# Patient Record
Sex: Female | Born: 1960 | Race: White | Hispanic: No | Marital: Married | Smoking: Former smoker
Health system: Southern US, Community
[De-identification: ages and names within clinical notes are randomized; demographics above are authoritative.]

## PROBLEM LIST (undated history)

## (undated) DIAGNOSIS — E039 Hypothyroidism, unspecified: Secondary | ICD-10-CM

## (undated) DIAGNOSIS — K219 Gastro-esophageal reflux disease without esophagitis: Secondary | ICD-10-CM

## (undated) HISTORY — PX: OTHER SURGICAL HISTORY: SHX169

## (undated) HISTORY — PX: NOVASURE ABLATION: SHX5394

## (undated) HISTORY — PX: CARDIAC CATHETERIZATION: SHX172

---

## 2006-09-11 ENCOUNTER — Ambulatory Visit: Payer: Self-pay

## 2007-05-20 ENCOUNTER — Encounter: Payer: Self-pay | Admitting: Urology

## 2007-05-25 ENCOUNTER — Encounter: Payer: Self-pay | Admitting: Urology

## 2008-11-11 ENCOUNTER — Ambulatory Visit: Payer: Self-pay | Admitting: Obstetrics and Gynecology

## 2008-11-14 ENCOUNTER — Ambulatory Visit: Payer: Self-pay | Admitting: Gastroenterology

## 2008-11-18 ENCOUNTER — Ambulatory Visit: Payer: Self-pay | Admitting: Obstetrics and Gynecology

## 2012-05-14 DIAGNOSIS — Z79899 Other long term (current) drug therapy: Secondary | ICD-10-CM | POA: Insufficient documentation

## 2013-05-17 DIAGNOSIS — N3946 Mixed incontinence: Secondary | ICD-10-CM | POA: Insufficient documentation

## 2013-05-17 DIAGNOSIS — R350 Frequency of micturition: Secondary | ICD-10-CM | POA: Insufficient documentation

## 2013-05-17 DIAGNOSIS — R3915 Urgency of urination: Secondary | ICD-10-CM | POA: Insufficient documentation

## 2014-03-23 ENCOUNTER — Ambulatory Visit: Payer: Self-pay | Admitting: Family Medicine

## 2014-03-23 DIAGNOSIS — I369 Nonrheumatic tricuspid valve disorder, unspecified: Secondary | ICD-10-CM

## 2014-06-06 ENCOUNTER — Ambulatory Visit: Payer: Self-pay | Admitting: Gastroenterology

## 2015-04-27 DIAGNOSIS — M722 Plantar fascial fibromatosis: Secondary | ICD-10-CM | POA: Insufficient documentation

## 2015-06-15 DIAGNOSIS — M19049 Primary osteoarthritis, unspecified hand: Secondary | ICD-10-CM | POA: Insufficient documentation

## 2015-09-11 DIAGNOSIS — G43019 Migraine without aura, intractable, without status migrainosus: Secondary | ICD-10-CM | POA: Insufficient documentation

## 2015-09-11 DIAGNOSIS — Z8619 Personal history of other infectious and parasitic diseases: Secondary | ICD-10-CM | POA: Insufficient documentation

## 2015-11-03 DIAGNOSIS — M542 Cervicalgia: Secondary | ICD-10-CM | POA: Insufficient documentation

## 2016-06-02 DIAGNOSIS — E039 Hypothyroidism, unspecified: Secondary | ICD-10-CM | POA: Insufficient documentation

## 2016-06-02 DIAGNOSIS — F411 Generalized anxiety disorder: Secondary | ICD-10-CM | POA: Insufficient documentation

## 2016-06-02 DIAGNOSIS — E782 Mixed hyperlipidemia: Secondary | ICD-10-CM | POA: Insufficient documentation

## 2016-08-26 DIAGNOSIS — M79673 Pain in unspecified foot: Secondary | ICD-10-CM | POA: Insufficient documentation

## 2016-08-26 DIAGNOSIS — S93409A Sprain of unspecified ligament of unspecified ankle, initial encounter: Secondary | ICD-10-CM | POA: Insufficient documentation

## 2019-09-10 DIAGNOSIS — R202 Paresthesia of skin: Secondary | ICD-10-CM | POA: Insufficient documentation

## 2019-09-10 DIAGNOSIS — R200 Anesthesia of skin: Secondary | ICD-10-CM | POA: Insufficient documentation

## 2020-03-01 ENCOUNTER — Ambulatory Visit (INDEPENDENT_AMBULATORY_CARE_PROVIDER_SITE_OTHER): Payer: 59 | Admitting: Dermatology

## 2020-03-01 ENCOUNTER — Other Ambulatory Visit: Payer: Self-pay

## 2020-03-01 DIAGNOSIS — L821 Other seborrheic keratosis: Secondary | ICD-10-CM

## 2020-03-01 DIAGNOSIS — L578 Other skin changes due to chronic exposure to nonionizing radiation: Secondary | ICD-10-CM | POA: Diagnosis not present

## 2020-03-01 DIAGNOSIS — D2372 Other benign neoplasm of skin of left lower limb, including hip: Secondary | ICD-10-CM

## 2020-03-01 DIAGNOSIS — D239 Other benign neoplasm of skin, unspecified: Secondary | ICD-10-CM

## 2020-03-01 DIAGNOSIS — L820 Inflamed seborrheic keratosis: Secondary | ICD-10-CM | POA: Diagnosis not present

## 2020-03-01 DIAGNOSIS — L858 Other specified epidermal thickening: Secondary | ICD-10-CM

## 2020-03-01 DIAGNOSIS — L918 Other hypertrophic disorders of the skin: Secondary | ICD-10-CM

## 2020-03-01 NOTE — Progress Notes (Signed)
° °  New Patient Visit  Subjective  Robin Choi is a 59 y.o. female who presents for the following: Skin Problem.  Patient here today for spots at trunk that itch and get irritated by clothing. Present for years, some treated in the past with LN2. Also has a hard spot on the side of her nose that she picks at. Present for about 1 year or more.  Patient has bumps on right arm near elbow, present for years and they itch. No history of skin cancer.  Spot at right leg that she scratches at and said she feels like it has opened.  The following portions of the chart were reviewed this encounter and updated as appropriate:      Review of Systems:  No other skin or systemic complaints except as noted in HPI or Assessment and Plan.  Objective  Well appearing patient in no apparent distress; mood and affect are within normal limits.  A focused examination was performed including face, trunk, arms, legs. Relevant physical exam findings are noted in the Assessment and Plan.  Objective  Right Lateral Thigh: Firm pink/brown papulenodule with dimple sign.   Objective  Bilateral inguinal crease x 14, Bilateral inframammary x 18, Left back at braline x 1 (33): Erythematous keratotic or waxy stuck-on papules.  R nasal root clear today- pt said she recently picked the spot off   Objective  Bilateral legs: Tiny follicular keratotic papules.    Assessment & Plan  Dermatofibroma Right Lateral Thigh  Benign, observe.    Inflamed seborrheic keratosis (33) Bilateral inguinal crease x 14, Bilateral inframammary x 18, Left back at braline x 1  Cryotherapy today Prior to procedure, discussed risks of blister formation, small wound, skin dyspigmentation, or rare scar following cryotherapy.    Destruction of lesion - Bilateral inguinal crease x 14, Bilateral inframammary x 18, Left back at braline x 1  Destruction method: cryotherapy   Informed consent: discussed and consent obtained     Lesion destroyed using liquid nitrogen: Yes   Region frozen until ice ball extended beyond lesion: Yes   Outcome: patient tolerated procedure well with no complications   Post-procedure details: wound care instructions given    Keratosis pilaris Bilateral legs  Benign, observe. Recommend mild soap and moisturizing cream 1-2 times daily.   Recommend Amlactin cream, Gold Bond Rough and Bumpy, Eucerin Roughness relief   Seborrheic Keratoses - Stuck-on, waxy, tan-brown papules and plaques R arm, L wrist, back - Discussed benign etiology and prognosis. - Observe - Call for any changes  Actinic Damage - diffuse scaly erythematous macules with underlying dyspigmentation - Recommend daily broad spectrum sunscreen SPF 30+ to sun-exposed areas, reapply every 2 hours as needed.  - Call for new or changing lesions.  Acrochordons (Skin Tags) - Fleshy, skin-colored pedunculated papules - Benign appearing.  - Observe. - If desired, they can be removed with an in office procedure that is not covered by insurance. - Please call the clinic if you notice any new or changing lesions.   Return in about 2 months (around 05/01/2020) for ISK.  Graciella Belton, RMA, am acting as scribe for Brendolyn Patty, MD .  Documentation: I have reviewed the above documentation for accuracy and completeness, and I agree with the above.  Brendolyn Patty MD

## 2020-03-01 NOTE — Patient Instructions (Addendum)
Recommend daily broad spectrum sunscreen SPF 30+ to sun-exposed areas, reapply every 2 hours as needed. Call for new or changing lesions.  Seborrheic Keratoses - Stuck-on, waxy, tan-brown papules and plaques  - Discussed benign etiology and prognosis. - Observe - Call for any changes   Cryotherapy Aftercare  . Wash gently with soap and water everyday.   Marland Kitchen Apply Vaseline and Band-Aid daily until healed. . Can Shower

## 2020-05-09 ENCOUNTER — Other Ambulatory Visit: Payer: Self-pay

## 2020-05-09 ENCOUNTER — Ambulatory Visit (INDEPENDENT_AMBULATORY_CARE_PROVIDER_SITE_OTHER): Payer: 59 | Admitting: Dermatology

## 2020-05-09 DIAGNOSIS — L820 Inflamed seborrheic keratosis: Secondary | ICD-10-CM

## 2020-05-09 DIAGNOSIS — L578 Other skin changes due to chronic exposure to nonionizing radiation: Secondary | ICD-10-CM | POA: Diagnosis not present

## 2020-05-09 DIAGNOSIS — L821 Other seborrheic keratosis: Secondary | ICD-10-CM | POA: Diagnosis not present

## 2020-05-09 DIAGNOSIS — L738 Other specified follicular disorders: Secondary | ICD-10-CM | POA: Diagnosis not present

## 2020-05-09 NOTE — Progress Notes (Signed)
   Follow-Up Visit   Subjective  Robin Choi is a 58 y.o. female who presents for the following: 2 month follow-up ISKs (Inframammary, inguinal creases, back improved with LN2 treatment. Similar itchy, irritated spots in same areas to treat today. Worse with sweating.).   The following portions of the chart were reviewed this encounter and updated as appropriate:      Review of Systems:  No other skin or systemic complaints except as noted in HPI or Assessment and Plan.  Objective  Well appearing patient in no apparent distress; mood and affect are within normal limits.  A focused examination was performed including inframammary, inguinal creases, back, chest. Relevant physical exam findings are noted in the Assessment and Plan.  Objective  L flank at braline x 10, R flank at braline x 7, L inguinal crease x 1, R inguinal crease x 2, R inframammary x 1, central chest x 12 (33): Erythematous keratotic or waxy stuck-on papule or plaque.    Assessment & Plan    Actinic Damage - diffuse scaly erythematous macules with underlying dyspigmentation - Recommend daily broad spectrum sunscreen SPF 30+ to sun-exposed areas, reapply every 2 hours as needed.  - Call for new or changing lesions.  Sebaceous Hyperplasia - Small yellow papules with a central dell on left temple - Benign - Observe   Seborrheic Keratoses - Stuck-on, waxy, tan-brown papules and plaques  - Discussed benign etiology and prognosis. - Observe - Call for any changes  Inflamed seborrheic keratosis (33) L flank at braline x 10, R flank at braline x 7, L inguinal crease x 1, R inguinal crease x 2, R inframammary x 1, central chest x 12  Destruction of lesion - L flank at braline x 10, R flank at braline x 7, L inguinal crease x 1, R inguinal crease x 2, R inframammary x 1, central chest x 12  Destruction method: cryotherapy   Informed consent: discussed and consent obtained   Lesion destroyed using liquid  nitrogen: Yes   Region frozen until ice ball extended beyond lesion: Yes   Outcome: patient tolerated procedure well with no complications   Post-procedure details: wound care instructions given    Return if symptoms worsen or fail to improve.   IJamesetta Orleans, CMA, am acting as scribe for Brendolyn Patty, MD .  Documentation: I have reviewed the above documentation for accuracy and completeness, and I agree with the above.  Brendolyn Patty MD

## 2020-05-09 NOTE — Patient Instructions (Addendum)

## 2020-12-13 ENCOUNTER — Other Ambulatory Visit: Payer: Self-pay

## 2020-12-13 ENCOUNTER — Ambulatory Visit (INDEPENDENT_AMBULATORY_CARE_PROVIDER_SITE_OTHER): Payer: 59 | Admitting: Dermatology

## 2020-12-13 DIAGNOSIS — L304 Erythema intertrigo: Secondary | ICD-10-CM | POA: Diagnosis not present

## 2020-12-13 DIAGNOSIS — L309 Dermatitis, unspecified: Secondary | ICD-10-CM

## 2020-12-13 DIAGNOSIS — L820 Inflamed seborrheic keratosis: Secondary | ICD-10-CM

## 2020-12-13 MED ORDER — TACROLIMUS 0.1 % EX OINT: TOPICAL_OINTMENT | CUTANEOUS | 2 refills | Status: DC

## 2020-12-13 MED ORDER — CLOBETASOL PROPIONATE 0.05 % EX CREA: TOPICAL_CREAM | CUTANEOUS | 1 refills | Status: AC

## 2020-12-13 MED ORDER — KETOCONAZOLE 2 % EX CREA: TOPICAL_CREAM | CUTANEOUS | 2 refills | Status: DC

## 2020-12-13 NOTE — Patient Instructions (Addendum)
Cryotherapy Aftercare  . Wash gently with soap and water everyday.   Marland Kitchen Apply Vaseline and Band-Aid daily until healed.   Intertrigo is a chronic recurrent rash that occurs in skin fold areas that may be associated with friction; heat; moisture; yeast; fungus; and bacteria.  It is exacerbated by increased movement / activity; sweating; and higher atmospheric temperature. May use Zeasorb AF Powder during the day to body folds.  Hand Dermatitis is a chronic type of eczema that can come and go on the hands and fingers.  While there is no cure, the rash and symptoms can be managed with topical prescription medications, and for more severe cases, with systemic medications.  Recommend mild soap and routine use of moisturizing cream after handwashing.  Minimize soap/water exposure when possible.      Topical steroids (such as triamcinolone, fluocinolone, fluocinonide, mometasone, clobetasol, halobetasol, betamethasone, hydrocortisone) can cause thinning and lightening of the skin if they are used for too long in the same area. Your physician has selected the right strength medicine for your problem and area affected on the body. Please use your medication only as directed by your physician to prevent side effects.    If you have any questions or concerns for your doctor, please call our main line at [phone] and press option 4 to reach your doctor's medical assistant. If no one answers, please leave a voicemail as directed and we will return your call as soon as possible. Messages left after 4 pm will be answered the following business day.   You may also send [place] a message via [place]. We typically respond to MyChart messages within 1-2 business days.  For prescription refills, please ask your pharmacy to contact our office. Our fax number is [phone].  If you have an urgent issue when the clinic is closed that cannot wait until the next business day, you can page your doctor at the number  below.    Please note that while we do our best to be available for urgent issues outside of office hours, we are not available 24/7.   If you have an urgent issue and are unable to reach [place], you may choose to seek medical care at your doctor's office, retail clinic, urgent care center, or emergency room.  If you have a medical emergency, please immediately call 911 or go to the emergency department.  Pager Numbers  - Dr. Nehemiah Massed: [phone]  - Dr. Laurence Ferrari: [phone]  - Dr. Nicole Kindred: [phone]  In the event of inclement weather, please call our main line at [phone] for an update on the status of any delays or closures.  Dermatology Medication Tips: Please keep the boxes that topical medications come in in order to help keep track of the instructions about where and how to use these. Pharmacies typically print the medication instructions only on the boxes and not directly on the medication tubes.   If your medication is too expensive, please contact our office at [phone] option 4 or send [place] a message through [place].   We are unable to tell what your co-pay for medications will be in advance as this is different depending on your insurance coverage. However, we may be able to find a substitute medication at lower cost or fill out paperwork to get insurance to cover a needed medication.   If a prior authorization is required to get your medication covered by your insurance company, please allow [place] 1-2 business days to complete this process.  Drug prices often  vary depending on where the prescription is filled and some pharmacies may offer cheaper prices.  The website www.goodrx.com contains coupons for medications through different pharmacies. The prices here do not account for what the cost may be with help from insurance (it may be cheaper with your insurance), but the website can give you the price if you did not use any insurance.  - You can print the associated coupon  and take it with your prescription to the pharmacy.  - You may also stop by our office during regular business hours and pick up a GoodRx coupon card.  - If you need your prescription sent electronically to a different pharmacy, notify our office through [facility] or by phone at [phone] option 4.

## 2020-12-13 NOTE — Progress Notes (Signed)
Follow-Up Visit   Subjective  Robin Choi is a 60 y.o. female who presents for the following: Skin Problem (Patient here for painful growths on fingers. She wears gloves and washer her hands a lot. No drainage, itchy a little. She also has itchy growths of the inguinal creases. She has a history of ISKs of the inguinal and inframammary areas.).  She has a few Isks on sides that are still there after LN2 treatment that are bothersome. Also similar spot on abdomen   The following portions of the chart were reviewed this encounter and updated as appropriate:       Review of Systems:  No other skin or systemic complaints except as noted in HPI or Assessment and Plan.  Objective  Well appearing patient in no apparent distress; mood and affect are within normal limits.  A focused examination was performed including face, hands, inguinal. Relevant physical exam findings are noted in the Assessment and Plan.  Objective  fingers: Mild erythema and scale with thickening of skin on R 4th and 5th PIP, L 4th PIP  Objective  inguinal creases: Erythema of the bilateral inguinal creases.  Objective  R abd x 1, L flank x 4, R flank x 4 (9): Erythematous keratotic or waxy stuck-on papule or plaque.    Assessment & Plan  Dermatitis fingers  Irritant Hand Dermatitis  Hand Dermatitis is a chronic type of eczema that can come and go on the hands and fingers.  While there is no cure, the rash and symptoms can be managed with topical prescription medications, and for more severe cases, with systemic medications.  Recommend mild soap and routine use of moisturizing cream after handwashing.  Minimize soap/water exposure when possible.     Start clobetasol cream Apply to AA hands/fingers BID until rash improved dsp 30g 1Rf. Avoid face, groin, axilla.    Topical steroids (such as triamcinolone, fluocinolone, fluocinonide, mometasone, clobetasol, halobetasol, betamethasone, hydrocortisone)  can cause thinning and lightening of the skin if they are used for too long in the same area. Your physician has selected the right strength medicine for your problem and area affected on the body. Please use your medication only as directed by your physician to prevent side effects.     clobetasol cream (TEMOVATE) 0.05 % - fingers  Erythema intertrigo inguinal creases  Intertrigo is a chronic recurrent rash that occurs in skin fold areas that may be associated with friction; heat; moisture; yeast; fungus; and bacteria.  It is exacerbated by increased movement / activity; sweating; and higher atmospheric temperature.  Start 2% ketoconazole cream Apply to AA qd/bid prn rash. Start tacrolimus ointment Apply to AA qd/bid prn itch. Start Zeasorb AF Powder daily after shower.  ketoconazole (NIZORAL) 2 % cream - inguinal creases  tacrolimus (PROTOPIC) 0.1 % ointment - inguinal creases  Inflamed seborrheic keratosis (9) R abd x 1, L flank x 4, R flank x 4  Prior to procedure, discussed risks of blister formation, small wound, skin dyspigmentation, or rare scar following cryotherapy.    Destruction of lesion - R abd x 1, L flank x 4, R flank x 4  Destruction method: cryotherapy   Informed consent: discussed and consent obtained   Lesion destroyed using liquid nitrogen: Yes   Region frozen until ice ball extended beyond lesion: Yes   Outcome: patient tolerated procedure well with no complications   Post-procedure details: wound care instructions given    Return in about 6 weeks (around 01/24/2021) for Hand  dermatitis, Intertrigo.   IJamesetta Orleans, CMA, am acting as scribe for Brendolyn Patty, MD .  Documentation: I have reviewed the above documentation for accuracy and completeness, and I agree with the above.  Brendolyn Patty MD

## 2021-01-22 ENCOUNTER — Ambulatory Visit: Payer: 59 | Admitting: Dermatology

## 2021-07-30 ENCOUNTER — Ambulatory Visit
Admission: EM | Admit: 2021-07-30 | Discharge: 2021-07-30 | Disposition: A | Discharge status: Home / Self Care | Payer: 59 | Attending: Emergency Medicine | Admitting: Emergency Medicine

## 2021-07-30 ENCOUNTER — Encounter: Payer: Self-pay | Admitting: Emergency Medicine

## 2021-07-30 ENCOUNTER — Other Ambulatory Visit: Payer: Self-pay

## 2021-07-30 DIAGNOSIS — J22 Unspecified acute lower respiratory infection: Secondary | ICD-10-CM | POA: Diagnosis not present

## 2021-07-30 MED ORDER — DOXYCYCLINE HYCLATE 100 MG PO CAPS: 100.0000 mg | ORAL_CAPSULE | Freq: Two times a day (BID) | ORAL | 0 refills | Status: AC

## 2021-07-30 NOTE — Discharge Instructions (Addendum)
Take doxycycline as prescribed.  Always read the labels of cough and cold medications as they may contain some of the ingredients below.  Rest, push lots of fluids (especially water), and utilize supportive care for symptoms. You may take acetaminophen (Tylenol) every 4-6 hours and ibuprofen every 6-8 hours for muscle pain, joint pain, headaches (you may also alternate these medications). Mucinex (guaifenesin) may be taken over the counter for cough as needed can loosen phlegm. Please read the instructions and take as directed.  Sudafed (pseudophedrine) is sold behind the counter and can help reduce nasal pressure; avoid taking this if you have high blood pressure or feel jittery. Sudafed PE (phenylephrine) can be a helpful, short-term, over-the-counter alternative to limit side effects or if you have high blood pressure.  Saline nasal sprays or rinses can also help nasal congestion (use bottled or sterile water). Warm tea with lemon and honey can sooth sore throat and cough, as can cough drops.   Return to clinic for high fever not improving with medications, chest pain, difficulty breathing, non-stop vomiting, or coughing blood. Follow-up with your primary care provider if symptoms do not improve as expected in the next 5-7 days.

## 2021-07-30 NOTE — ED Provider Notes (Signed)
CHIEF COMPLAINT:   Chief Complaint  Patient presents with   Cough   Nasal Congestion   Chest Congestion     SUBJECTIVE/HPI:   Cough A very pleasant 60 y.o.Female presents today with cough, nasal congestion chest congestion for the last 2 months.  Patient has tried Sudafed with mild relief of congestion, but not with cough. Patient reports some chest pain with continued coughing and green mucus. Patient does not report any shortness of breath, palpitations, visual changes, weakness, tingling, headache, nausea, vomiting, diarrhea, fever, chills.   has no past medical history on file.  ROS:  Review of Systems  Respiratory:  Positive for cough.   See Subjective/HPI Medications, Allergies and Problem List personally reviewed in Epic today OBJECTIVE:   Vitals:   07/30/21 0914  BP: (!) 157/87  Pulse: 69  Resp: 18  Temp: 98.4 F (36.9 C)  SpO2: 96%    Physical Exam   General: Appears well-developed and well-nourished. No acute distress.  HEENT Head: Normocephalic and atraumatic.   Ears: Hearing grossly intact, no drainage or visible deformity.  Nose: No nasal deviation.   Mouth/Throat: No stridor or tracheal deviation.  Non erythematous posterior pharynx noted with clear drainage present.  No white patchy exudate noted. Eyes: Conjunctivae and EOM are normal. No eye drainage or scleral icterus bilaterally.  Neck: Normal range of motion, neck is supple.  Cardiovascular: Normal rate. Regular rhythm; no murmurs, gallops, or rubs.  Pulm/Chest: No respiratory distress.  Bilateral upper lobes CTA.  Bilateral lower lobes rhonchi.  No wheezing or rales. Neurological: Alert and oriented to person, place, and time.  Skin: Skin is warm and dry.  No rashes, lesions, abrasions or bruising noted to skin.   Psychiatric: Normal mood, affect, behavior, and thought content.   Vital signs and nursing note reviewed.   Patient stable and cooperative with examination. PROCEDURES:     LABS/X-RAYS/EKG/MEDS:   No results found for any visits on 07/30/21.  MEDICAL DECISION MAKING:   Patient presents with cough, nasal congestion chest congestion for the last 2 months.  Patient has tried Sudafed with mild relief of congestion, but not with cough. Patient reports some chest pain with continued coughing and green mucus. Patient does not report any shortness of breath, palpitations, visual changes, weakness, tingling, headache, nausea, vomiting, diarrhea, fever, chills.  Given symptoms along with assessment findings and timeframe of onset, will Rx doxycycline for lower respiratory tract infection.  Advised about home treatment and care to include rest, fluids, Tylenol, ibuprofen, Mucinex and Sudafed.  Follow-up with PCP if not improving over the next 5 to 7 days.  Return for any new high fever not improved with medications, chest pain, difficulty breathing, nonstop vomiting or coughing up blood.   Patient verbalized understanding and agreed with treatment plan.  Patient stable upon discharge. ASSESSMENT/PLAN:  1. Lower respiratory tract infection - doxycycline (VIBRAMYCIN) 100 MG capsule; Take 1 capsule (100 mg total) by mouth 2 (two) times daily for 10 days.  Dispense: 20 capsule; Refill: 0 Instructions about new medications and side effects provided.  Plan:   Discharge Instructions      Take doxycycline as prescribed.  Always read the labels of cough and cold medications as they may contain some of the ingredients below.  Rest, push lots of fluids (especially water), and utilize supportive care for symptoms. You may take acetaminophen (Tylenol) every 4-6 hours and ibuprofen every 6-8 hours for muscle pain, joint pain, headaches (you may also alternate these medications). Mucinex (guaifenesin)  may be taken over the counter for cough as needed can loosen phlegm. Please read the instructions and take as directed.  Sudafed (pseudophedrine) is sold behind the counter and can  help reduce nasal pressure; avoid taking this if you have high blood pressure or feel jittery. Sudafed PE (phenylephrine) can be a helpful, short-term, over-the-counter alternative to limit side effects or if you have high blood pressure.  Saline nasal sprays or rinses can also help nasal congestion (use bottled or sterile water). Warm tea with lemon and honey can sooth sore throat and cough, as can cough drops.   Return to clinic for high fever not improving with medications, chest pain, difficulty breathing, non-stop vomiting, or coughing blood. Follow-up with your primary care provider if symptoms do not improve as expected in the next 5-7 days.          Serafina Royals, [place] 07/30/21 [phone]

## 2021-07-30 NOTE — ED Triage Notes (Signed)
Pt here with cough, nasal congestion and chest congestion x 2 months.

## 2021-11-07 ENCOUNTER — Ambulatory Visit: Payer: 59 | Admitting: Podiatry

## 2022-01-02 ENCOUNTER — Inpatient Hospital Stay: Payer: 59 | Attending: Oncology | Admitting: Oncology

## 2022-01-02 ENCOUNTER — Encounter: Payer: Self-pay | Admitting: Oncology

## 2022-01-02 ENCOUNTER — Inpatient Hospital Stay: Payer: 59

## 2022-01-02 VITALS — BP 137/66 | HR 64 | Temp 97.4°F | Resp 18 | Wt 165.0 lb

## 2022-01-02 DIAGNOSIS — D75839 Thrombocytosis, unspecified: Secondary | ICD-10-CM

## 2022-01-02 DIAGNOSIS — Z800 Family history of malignant neoplasm of digestive organs: Secondary | ICD-10-CM | POA: Diagnosis not present

## 2022-01-02 DIAGNOSIS — Z87891 Personal history of nicotine dependence: Secondary | ICD-10-CM | POA: Diagnosis not present

## 2022-01-02 DIAGNOSIS — Z809 Family history of malignant neoplasm, unspecified: Secondary | ICD-10-CM

## 2022-01-02 DIAGNOSIS — R109 Unspecified abdominal pain: Secondary | ICD-10-CM | POA: Diagnosis not present

## 2022-01-02 DIAGNOSIS — Z808 Family history of malignant neoplasm of other organs or systems: Secondary | ICD-10-CM | POA: Insufficient documentation

## 2022-01-02 DIAGNOSIS — M25559 Pain in unspecified hip: Secondary | ICD-10-CM | POA: Diagnosis not present

## 2022-01-02 DIAGNOSIS — F32A Depression, unspecified: Secondary | ICD-10-CM | POA: Insufficient documentation

## 2022-01-02 DIAGNOSIS — D473 Essential (hemorrhagic) thrombocythemia: Secondary | ICD-10-CM | POA: Insufficient documentation

## 2022-01-02 DIAGNOSIS — Z803 Family history of malignant neoplasm of breast: Secondary | ICD-10-CM | POA: Diagnosis not present

## 2022-01-02 DIAGNOSIS — R102 Pelvic and perineal pain: Secondary | ICD-10-CM | POA: Insufficient documentation

## 2022-01-02 DIAGNOSIS — K219 Gastro-esophageal reflux disease without esophagitis: Secondary | ICD-10-CM | POA: Insufficient documentation

## 2022-01-02 DIAGNOSIS — G43909 Migraine, unspecified, not intractable, without status migrainosus: Secondary | ICD-10-CM | POA: Insufficient documentation

## 2022-01-02 LAB — CBC WITH DIFFERENTIAL/PLATELET
Abs Immature Granulocytes: 0.02 10*3/uL (ref 0.00–0.07)
Basophils Absolute: 0.1 10*3/uL (ref 0.0–0.1)
Basophils Relative: 1 %
Eosinophils Absolute: 0.3 10*3/uL (ref 0.0–0.5)
Eosinophils Relative: 5 %
HCT: 40.8 % (ref 36.0–46.0)
Hemoglobin: 13.2 g/dL (ref 12.0–15.0)
Immature Granulocytes: 0 %
Lymphocytes Relative: 33 %
Lymphs Abs: 2.1 10*3/uL (ref 0.7–4.0)
MCH: 29.3 pg (ref 26.0–34.0)
MCHC: 32.4 g/dL (ref 30.0–36.0)
MCV: 90.5 fL (ref 80.0–100.0)
Monocytes Absolute: 0.5 10*3/uL (ref 0.1–1.0)
Monocytes Relative: 8 %
Neutro Abs: 3.3 10*3/uL (ref 1.7–7.7)
Neutrophils Relative %: 53 %
Platelets: 827 10*3/uL — ABNORMAL HIGH (ref 150–400)
RBC: 4.51 MIL/uL (ref 3.87–5.11)
RDW: 13.2 % (ref 11.5–15.5)
WBC: 6.4 10*3/uL (ref 4.0–10.5)
nRBC: 0 % (ref 0.0–0.2)

## 2022-01-02 LAB — IRON AND TIBC
Iron: 62 ug/dL (ref 28–170)
Saturation Ratios: 19 % (ref 10.4–31.8)
TIBC: 323 ug/dL (ref 250–450)
UIBC: 261 ug/dL

## 2022-01-02 LAB — FERRITIN: Ferritin: 115 ng/mL (ref 11–307)

## 2022-01-02 LAB — HEPATITIS PANEL, ACUTE
HCV Ab: NONREACTIVE
Hep A IgM: NONREACTIVE
Hep B C IgM: NONREACTIVE
Hepatitis B Surface Ag: NONREACTIVE

## 2022-01-02 LAB — HIV ANTIBODY (ROUTINE TESTING W REFLEX): HIV Screen 4th Generation wRfx: NONREACTIVE

## 2022-01-02 LAB — TECHNOLOGIST SMEAR REVIEW: Plt Morphology: INCREASED

## 2022-01-02 LAB — TSH: TSH: 1.549 u[IU]/mL (ref 0.350–4.500)

## 2022-01-02 NOTE — Progress Notes (Signed)
Patient states that she has been having UTI symptoms, has recently finished antibiotics, but still having issues. Has had abnormal pap smears in the past. Patient states that it feels like she is "sitting on a pick" and that she "has to push something out before her pee will come out" she also reports chronic incontinence and bloating feeling.  ?

## 2022-01-02 NOTE — Progress Notes (Signed)
?Hematology/Oncology Consult note ?[phone] [phone] ?  ? ?   ? ? ?Patient Care Team: ?Lada, Satira Anis, MD as PCP - General (Family Medicine) ?Arnetha Courser, MD (Family Medicine) ? ?REFERRING PROVIDER: ?Maryland Pink, MD  ?CHIEF COMPLAINTS/REASON FOR VISIT:  ?Evaluation of thrombocytosis ? ?HISTORY OF PRESENTING ILLNESS:  ? ?Robin Choi is a  61 y.o.  female with PMH listed below was seen in consultation at the request of  Maryland Pink, MD  for evaluation of thrombocytosis ? ?For the past few months, patient reports lower abdominal pressure, dysuria symptoms.  She feels like "sitting on a ball" ? patient has had evaluation by PCP and gynecology.  Patient reports frequent history of UTIs in the past. ?Patient had negative Pap smear, wet prep and ultrasound done by Dr. Chrystine Oiler. ?Dr. Leafy Ro reviewed the ultrasound results and images with patient.  No abnormal findings.  Her bimanual examination revealed cervical tenderness, uterine tenderness and minimal obturator tenderness and +bilateral pudendal nerves were tender.  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 ? ?12/11/2021, CBC showed a platelet count of.  804,000. ?12/17/2021, platelet count 795,000 ?12/27/2021, platelet count 872,000. ?Patient reports 5 pound weight loss since March 2023.  She has good appetite.  Denies night sweats or fever. ? ?Patient reports having life stressors.  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  She has to lift him multiple times in order to provide care.  She lives with her husband and brother-in-law.  Husband is on disability and not able to lift patient ? ?Family history positive for father with carcinoma discovered in his neck, details unknown, breast cancer, colon cancer. ?She is overdue for colonoscopy.  She feels she could not  get anyone to take care of brother-in-law during weekdays. ? ?Review of Systems  ?Constitutional:  Negative for appetite change, chills, fatigue and fever.  ?HENT:   Negative for hearing loss and voice change.   ?Eyes:  Negative for eye problems.  ?Respiratory:  Negative for chest tightness and cough.   ?Cardiovascular:  Negative for chest pain.  ?Gastrointestinal:  Negative for abdominal distention and blood in stool.  ?     Abdomen/pelvic pain,   ?Endocrine: Negative for hot flashes.  ?Genitourinary:  Negative for difficulty urinating and frequency.   ?Musculoskeletal:  Negative for arthralgias.  ?Skin:  Negative for itching and rash.  ?Neurological:  Negative for extremity weakness.  ?Hematological:  Negative for adenopathy.  ?Psychiatric/Behavioral:  Negative for confusion.   ? ?MEDICAL HISTORY:  ?History reviewed. No pertinent past medical history. ? ?SURGICAL HISTORY: ?Past Surgical History:  ?Procedure Laterality Date  ? NOVASURE ABLATION    ? OTHER SURGICAL HISTORY    ? "tubal cauterization"  ? ? ?SOCIAL HISTORY: ?Social History  ? ?Socioeconomic History  ? Marital status: Married  ?  Spouse name: Not on file  ? Number of children: Not on file  ? Years of education: Not on file  ? Highest education level: Not on file  ?Occupational History  ? Not on file  ?Tobacco Use  ? Smoking status: Former  ?  Types: Cigarettes  ? Smokeless tobacco: Never  ? Tobacco comments:  ?  Quit 40 years ago  ?Vaping Use  ? Vaping Use: Never used  ?Substance and Sexual Activity  ? Alcohol use: Not Currently  ?  Comment: occational  beer  ? Drug use: Never  ? Sexual activity: Not Currently  ?Other Topics Concern  ? Not on file  ?Social History Narrative  ? Not on file  ? ?Social Determinants of Health  ? ?Financial Resource Strain: Not on file  ?Food Insecurity: Not on file  ?Transportation Needs: Not on file  ?Physical Activity: Not on file  ?Stress: Not on file  ?Social Connections: Not on file  ?Intimate Partner Violence: Not on  file  ? ? ?FAMILY HISTORY: ?Family History  ?Problem Relation Age of Onset  ? Diabetes Mother   ? Colon cancer Mother   ? Breast cancer Mother   ? Cancer Father   ?     carcinoma  ? Bone cancer Brother   ? Diabetes Maternal Grandmother   ? ? ?ALLERGIES:  is allergic to celecoxib, nitrofurantoin macrocrystal, sulfa antibiotics, and nitrofurantoin. ? ?MEDICATIONS:  ?Current Outpatient Medications  ?Medication Sig Dispense Refill  ? almotriptan (AXERT) 12.5 MG tablet Axert 12.5 mg tablet    ? ARMOUR THYROID 60 MG tablet Take 60 mg by mouth daily.    ? cetirizine (ZYRTEC) 10 MG tablet Take by mouth.    ? Cetirizine HCl 10 MG CAPS cetirizine 10 mg capsule    ? clobetasol cream (TEMOVATE) 0.05 % Apply to affected areas hands 1-2 times a day until rash improved. Avoid face, groin, underarms. 30 g 1  ? diclofenac (VOLTAREN) 75 MG EC tablet Take by mouth.    ? ergocalciferol (VITAMIN D2) 1.25 MG (50000 UT) capsule Take by mouth.    ? ketoconazole (NIZORAL) 2 % cream Apply to affected areas body folds 1-2 times a day as needed until rash improved. 60 g 2  ? lansoprazole (PREVACID) 15 MG capsule Prevacid 15 mg capsule,delayed release    ? tacrolimus (PROTOPIC) 0.1 % ointment Apply to affected areas body folds 1-2 times a day as needed for itch. 60 g 2  ? topiramate (TOPAMAX) 200 MG tablet Topamax 200 mg tablet    ? triamcinolone ointment (KENALOG) 0.1 % Apply topically.    ? valACYclovir (VALTREX) 1000 MG tablet Valtrex 1 gram tablet (Patient not taking: Reported on 01/02/2022)    ? ?No current facility-administered medications for this visit.  ? ? ? ?PHYSICAL EXAMINATION: ?ECOG PERFORMANCE STATUS: 1 - Symptomatic but completely ambulatory ?Vitals:  ? 01/02/22 1510  ?BP: 137/66  ?Pulse: 64  ?Resp: 18  ?Temp: (!) 97.4 ?F (36.3 ?C)  ?SpO2: 100%  ? ?Filed Weights  ? 01/02/22 1510  ?Weight: 165 lb (74.8 kg)  ? ? ?Physical Exam ?Constitutional:   ?   General: She is not in acute distress. ?HENT:  ?   Head: Normocephalic and  atraumatic.  ?Eyes:  ?   General: No scleral icterus. ?Cardiovascular:  ?   Rate and Rhythm: Normal rate and regular rhythm.  ?   Heart sounds: Normal heart sounds.  ?Pulmonary:  ?   Effort: Pulmonary effort is normal. No respiratory distress.  ?   Breath sounds: No wheezing.  ?Abdominal:  ?   General: Bowel sounds are normal. There is no distension.  ?   Palpations: Abdomen is soft.  ?Musculoskeletal:     ?   General: No deformity. Normal range of motion.  ?   Cervical back: Normal range of motion and neck supple.  ?Skin: ?   General: Skin is warm and dry.  ?   Findings: No erythema or rash.  ?Neurological:  ?   Mental Status: She is  alert and oriented to person, place, and time. Mental status is at baseline.  ?   Cranial Nerves: No cranial nerve deficit.  ?   Coordination: Coordination normal.  ?Psychiatric:     ?   Mood and Affect: Mood normal.  ? ? ?LABORATORY DATA:  ?I have reviewed the data as listed ?Lab Results  ?Component Value Date  ? WBC 6.4 01/02/2022  ? HGB 13.2 01/02/2022  ? HCT 40.8 01/02/2022  ? MCV 90.5 01/02/2022  ? PLT 827 (H) 01/02/2022  ? ?No results for input(s): NA, K, CL, CO2, GLUCOSE, BUN, CREATININE, CALCIUM, GFRNONAA, GFRAA, PROT, ALBUMIN, AST, ALT, ALKPHOS, BILITOT, BILIDIR, IBILI in the last 8760 hours. ?Iron/TIBC/Ferritin/ %Sat ?   ?Component Value Date/Time  ? IRON 62 01/02/2022 1557  ? TIBC 323 01/02/2022 1557  ? FERRITIN 115 01/02/2022 1557  ? IRONPCTSAT 19 01/02/2022 1557  ?  ? ? ?RADIOGRAPHIC STUDIES: ?I have personally reviewed the radiological images as listed and agreed with the findings in the report. ?No results found. ? ? ? ?ASSESSMENT & PLAN:  ?1. Thrombocytosis   ?2. Pain in joint involving pelvic region and thigh, unspecified laterality   ?3. Family history of cancer   ? ?Thrombocytosis, reactive versus myeloproliferative disease. ?Check CBC, smear, JAK2 mutation with reflex, BCR ABL 1 FISH, TSH, iron TIBC ferritin, hepatitis panel, HIV, ANA. ? ?#Chronic abdominal/pelvic  pain.  Etiology unknown.  Pending above work-up. ?#Family history of cancer, she is overdue for colonoscopy.  Recommend patient to establish care with GI for colonoscopy.  Or at least consider Cologuard.  Recom

## 2022-01-03 LAB — ANTINUCLEAR ANTIBODIES, IFA: ANA Ab, IFA: NEGATIVE

## 2022-01-07 LAB — BCR-ABL1 FISH
Cells Analyzed: 200
Cells Counted: 200

## 2022-01-08 ENCOUNTER — Other Ambulatory Visit: Payer: Self-pay

## 2022-01-08 ENCOUNTER — Telehealth: Payer: Self-pay

## 2022-01-08 DIAGNOSIS — Z800 Family history of malignant neoplasm of digestive organs: Secondary | ICD-10-CM

## 2022-01-08 DIAGNOSIS — Z1211 Encounter for screening for malignant neoplasm of colon: Secondary | ICD-10-CM

## 2022-01-08 MED ORDER — NA SULFATE-K SULFATE-MG SULF 17.5-3.13-1.6 GM/177ML PO SOLN: 1.0000 | Freq: Once | ORAL | 0 refills | Status: AC

## 2022-01-08 NOTE — Progress Notes (Signed)
Gastroenterology Pre-Procedure Review ? ?Request Date: Tuesday 01/15/22 ?Requesting Physician: Dr. Vicente Males ? ?PATIENT REVIEW QUESTIONS: The patient responded to the following health history questions as indicated:   ? ?1. Are you having any GI issues?  Took antibiotics that caused diarrhea, bowel habits have  changed   ?2. Do you have a personal history of Polyps? no ?3. Do you have a family history of Colon Cancer or Polyps? yes (mother colon cancer) ?4. Diabetes Mellitus? no ?5. Joint replacements in the past 12 months?no ?6. Major health problems in the past 3 months?no ?7. Any artificial heart valves, MVP, or defibrillator?no ?   ?MEDICATIONS & ALLERGIES:    ?Patient reports the following regarding taking any anticoagulation/antiplatelet therapy:   ?Plavix, Coumadin, Eliquis, Xarelto, Lovenox, Pradaxa, Brilinta, or Effient? no ?Aspirin? no ? ?Patient confirms/reports the following medications:  ?Current Outpatient Medications  ?Medication Sig Dispense Refill  ? almotriptan (AXERT) 12.5 MG tablet Axert 12.5 mg tablet    ? ARMOUR THYROID 60 MG tablet Take 60 mg by mouth daily.    ? cetirizine (ZYRTEC) 10 MG tablet Take by mouth.    ? Cetirizine HCl 10 MG CAPS cetirizine 10 mg capsule    ? clobetasol cream (TEMOVATE) 0.05 % Apply to affected areas hands 1-2 times a day until rash improved. Avoid face, groin, underarms. 30 g 1  ? diclofenac (VOLTAREN) 75 MG EC tablet Take by mouth.    ? ergocalciferol (VITAMIN D2) 1.25 MG (50000 UT) capsule Take by mouth.    ? ketoconazole (NIZORAL) 2 % cream Apply to affected areas body folds 1-2 times a day as needed until rash improved. 60 g 2  ? lansoprazole (PREVACID) 15 MG capsule Prevacid 15 mg capsule,delayed release    ? tacrolimus (PROTOPIC) 0.1 % ointment Apply to affected areas body folds 1-2 times a day as needed for itch. 60 g 2  ? topiramate (TOPAMAX) 200 MG tablet Topamax 200 mg tablet    ? triamcinolone ointment (KENALOG) 0.1 % Apply topically.    ? valACYclovir  (VALTREX) 1000 MG tablet Valtrex 1 gram tablet (Patient not taking: Reported on 01/02/2022)    ? ?No current facility-administered medications for this visit.  ? ? ?Patient confirms/reports the following allergies:  ?Allergies  ?Allergen Reactions  ? Celecoxib Hives  ? Nitrofurantoin Macrocrystal Hives  ? Sulfa Antibiotics Hives  ? Nitrofurantoin Hives  ? ? ?No orders of the defined types were placed in this encounter. ? ? ?AUTHORIZATION INFORMATION ?Primary Insurance: ?1D#: ?Group #: ? ?Secondary Insurance: ?1D#: ?Group #: ? ?SCHEDULE INFORMATION: ?Date: 01/15/21 ?Time: ?Location: [facility] ?

## 2022-01-08 NOTE — Telephone Encounter (Signed)
LVM for pt to return my call.   Melvie Paglia,CMA 

## 2022-01-09 ENCOUNTER — Encounter: Payer: Self-pay | Admitting: Gastroenterology

## 2022-01-10 ENCOUNTER — Telehealth: Payer: Self-pay | Admitting: *Deleted

## 2022-01-10 LAB — JAK2 V617F, REFLEX TO E12-15: Reflex: 15

## 2022-01-10 LAB — E12 - E15 (REFLEXED)

## 2022-01-10 NOTE — Telephone Encounter (Signed)
Spoke with patient who just wanted to know how long it would take to get the one lab result still pending. Advised per Dr. Tasia Catchings, this could take about another week. Informed patient that we would contact her with her results after Dr. Tasia Catchings receive and reviews them. Patient verbalized understanding.  ?

## 2022-01-10 NOTE — Telephone Encounter (Signed)
Patient called asking if her Leukemia test results are back yet and if not, how long before they will be back. Please return her call ?

## 2022-01-15 ENCOUNTER — Other Ambulatory Visit: Payer: Self-pay

## 2022-01-15 ENCOUNTER — Ambulatory Visit: Payer: 59 | Admitting: Registered Nurse

## 2022-01-15 ENCOUNTER — Encounter: Payer: Self-pay | Admitting: Gastroenterology

## 2022-01-15 ENCOUNTER — Encounter
Admission: RE | Disposition: A | Discharge status: Home / Self Care | Payer: Self-pay | Source: Home / Self Care | Attending: Gastroenterology

## 2022-01-15 ENCOUNTER — Ambulatory Visit
Admission: RE | Admit: 2022-01-15 | Discharge: 2022-01-15 | Disposition: A | Discharge status: Home / Self Care | Payer: 59 | Attending: Gastroenterology | Admitting: Gastroenterology

## 2022-01-15 DIAGNOSIS — Z87891 Personal history of nicotine dependence: Secondary | ICD-10-CM | POA: Diagnosis not present

## 2022-01-15 DIAGNOSIS — E039 Hypothyroidism, unspecified: Secondary | ICD-10-CM | POA: Diagnosis not present

## 2022-01-15 DIAGNOSIS — Z1211 Encounter for screening for malignant neoplasm of colon: Secondary | ICD-10-CM | POA: Insufficient documentation

## 2022-01-15 DIAGNOSIS — Z79890 Hormone replacement therapy (postmenopausal): Secondary | ICD-10-CM | POA: Insufficient documentation

## 2022-01-15 DIAGNOSIS — Z800 Family history of malignant neoplasm of digestive organs: Secondary | ICD-10-CM | POA: Diagnosis not present

## 2022-01-15 DIAGNOSIS — K5730 Diverticulosis of large intestine without perforation or abscess without bleeding: Secondary | ICD-10-CM | POA: Diagnosis not present

## 2022-01-15 DIAGNOSIS — Z79899 Other long term (current) drug therapy: Secondary | ICD-10-CM | POA: Insufficient documentation

## 2022-01-15 DIAGNOSIS — K219 Gastro-esophageal reflux disease without esophagitis: Secondary | ICD-10-CM | POA: Diagnosis not present

## 2022-01-15 HISTORY — DX: Hypothyroidism, unspecified: E03.9

## 2022-01-15 HISTORY — DX: Gastro-esophageal reflux disease without esophagitis: K21.9

## 2022-01-15 HISTORY — PX: COLONOSCOPY WITH PROPOFOL: SHX5780

## 2022-01-15 SURGERY — COLONOSCOPY WITH PROPOFOL
Anesthesia: General

## 2022-01-15 MED ORDER — GLYCOPYRROLATE 0.2 MG/ML IJ SOLN
INTRAMUSCULAR | Status: DC | PRN
Start: 2022-01-15 — End: 2022-01-15
  Administered 2022-01-15 (×2): .1 mg via INTRAVENOUS

## 2022-01-15 MED ORDER — SODIUM CHLORIDE 0.9 % IV SOLN: INTRAVENOUS | Status: DC

## 2022-01-15 MED ORDER — LIDOCAINE HCL (PF) 2 % IJ SOLN
INTRAMUSCULAR | Status: AC
  Filled 2022-01-15: qty 20

## 2022-01-15 MED ORDER — PHENYLEPHRINE HCL-NACL 20-0.9 MG/250ML-% IV SOLN
INTRAVENOUS | Status: AC
  Filled 2022-01-15: qty 250

## 2022-01-15 MED ORDER — PROPOFOL 10 MG/ML IV BOLUS
INTRAVENOUS | Status: DC | PRN
  Administered 2022-01-15: 70 mg via INTRAVENOUS

## 2022-01-15 MED ORDER — GLYCOPYRROLATE 0.2 MG/ML IJ SOLN
INTRAMUSCULAR | Status: AC
  Filled 2022-01-15: qty 2

## 2022-01-15 MED ORDER — LIDOCAINE HCL (CARDIAC) PF 100 MG/5ML IV SOSY
PREFILLED_SYRINGE | INTRAVENOUS | Status: DC | PRN
  Administered 2022-01-15: 100 mg via INTRAVENOUS

## 2022-01-15 MED ORDER — PROPOFOL 500 MG/50ML IV EMUL
INTRAVENOUS | Status: AC
  Filled 2022-01-15: qty 200

## 2022-01-15 MED ORDER — PROPOFOL 500 MG/50ML IV EMUL
INTRAVENOUS | Status: DC | PRN
  Administered 2022-01-15: 150 ug/kg/min via INTRAVENOUS

## 2022-01-15 MED ORDER — DEXMEDETOMIDINE HCL IN NACL 80 MCG/20ML IV SOLN
INTRAVENOUS | Status: AC
Start: 2022-01-15 — End: ?
  Filled 2022-01-15: qty 20

## 2022-01-15 NOTE — Anesthesia Postprocedure Evaluation (Signed)
Anesthesia Post Note ? ?Patient: Robin Choi ? ?Procedure(s) Performed: COLONOSCOPY WITH PROPOFOL ? ?Patient location during evaluation: Endoscopy ?Anesthesia Type: General ?Level of consciousness: awake and alert ?Pain management: pain level controlled ?Vital Signs Assessment: post-procedure vital signs reviewed and stable ?Respiratory status: spontaneous breathing, nonlabored ventilation, respiratory function stable and patient connected to nasal cannula oxygen ?Cardiovascular status: blood pressure returned to baseline and stable ?Postop Assessment: no apparent nausea or vomiting ?Anesthetic complications: no ? ? ?No notable events documented. ? ? ?Last Vitals:  ?Vitals:  ? 01/15/22 0819 01/15/22 0829  ?BP: 110/68 125/74  ?Pulse: 70 64  ?Resp: 15 15  ?Temp:    ?SpO2: 99% 100%  ?  ?Last Pain:  ?Vitals:  ? 01/15/22 0829  ?TempSrc:   ?PainSc: 0-No pain  ? ? ?  ?  ?  ?  ?  ?  ? ?Martha Clan ? ? ? ? ?

## 2022-01-15 NOTE — Transfer of Care (Signed)
Immediate Anesthesia Transfer of Care Note ? ?Patient: Robin Choi ? ?Procedure(s) Performed: COLONOSCOPY WITH PROPOFOL ? ?Patient Location: Endoscopy Unit ? ?Anesthesia Type:General ? ?Level of Consciousness: drowsy ? ?Airway & Oxygen Therapy: Patient Spontanous Breathing ? ?Post-op Assessment: Report given to RN and Post -op Vital signs reviewed and stable ? ?Post vital signs: Reviewed and stable ? ?Last Vitals:  ?Vitals Value Taken Time  ?BP 92/64 01/15/22 0809  ?Temp    ?Pulse 75 01/15/22 0809  ?Resp 15 01/15/22 0809  ?SpO2 98 % 01/15/22 0809  ?Vitals shown include unvalidated device data. ? ?Last Pain:  ?Vitals:  ? 01/15/22 0809  ?TempSrc:   ?PainSc: Asleep  ?   ? ?  ? ?Complications: No notable events documented. ?

## 2022-01-15 NOTE — Anesthesia Preprocedure Evaluation (Signed)
Anesthesia Evaluation  ?Patient identified by MRN, date of birth, ID band ?Patient awake ? ? ? ?Reviewed: ?Allergy & Precautions, H&P , NPO status , Patient's Chart, lab work & pertinent test results, reviewed documented beta blocker date and time  ? ?History of Anesthesia Complications ?Negative for: history of anesthetic complications ? ?Airway ?Mallampati: II ? ?TM Distance: >3 FB ?Neck ROM: full ? ? ? Dental ? ?(+) Dental Advidsory Given ?Permanent bridge x5:   ?Pulmonary ?neg pulmonary ROS, former smoker,  ?  ?Pulmonary exam normal ?breath sounds clear to auscultation ? ? ? ? ? ? Cardiovascular ?Exercise Tolerance: Good ?negative cardio ROS ?Normal cardiovascular exam ?Rhythm:regular Rate:Normal ? ? ?  ?Neuro/Psych ? Headaches, neg Seizures PSYCHIATRIC DISORDERS Anxiety Depression   ? GI/Hepatic ?Neg liver ROS, GERD  ,  ?Endo/Other  ?neg diabetesHypothyroidism  ? Renal/GU ?negative Renal ROS  ?negative genitourinary ?  ?Musculoskeletal ? ? Abdominal ?  ?Peds ? Hematology ?negative hematology ROS ?(+)   ?Anesthesia Other Findings ?Past Medical History: ?No date: GERD (gastroesophageal reflux disease) ?No date: Hypothyroidism ? ? Reproductive/Obstetrics ?negative OB ROS ? ?  ? ? ? ? ? ? ? ? ? ? ? ? ? ?  ?  ? ? ? ? ? ? ? ? ?Anesthesia Physical ?Anesthesia Plan ? ?ASA: 2 ? ?Anesthesia Plan: General  ? ?Post-op Pain Management:   ? ?Induction: Intravenous ? ?PONV Risk Score and Plan: 3 and Propofol infusion and TIVA ? ?Airway Management Planned: Natural Airway and Nasal Cannula ? ?Additional Equipment:  ? ?Intra-op Plan:  ? ?Post-operative Plan:  ? ?Informed Consent: I have reviewed the patients History and Physical, chart, labs and discussed the procedure including the risks, benefits and alternatives for the proposed anesthesia with the patient or authorized representative who has indicated his/her understanding and acceptance.  ? ? ? ?Dental Advisory Given ? ?Plan Discussed with:  Anesthesiologist, CRNA and Surgeon ? ?Anesthesia Plan Comments:   ? ? ? ? ? ? ?Anesthesia Quick Evaluation ? ?

## 2022-01-15 NOTE — Op Note (Signed)
[facility] ?Gastroenterology ?Patient Name: Robin Choi ?Procedure Date: 01/15/2022 7:50 AM ?MRN: [id number] ?Account #: [id number] ?Date of Birth: 1961-07-03 ?Admit Type: Outpatient ?Age: 61 ?Room: [facility] ENDO ROOM 2 ?Gender: Female ?Note Status: Finalized ?Instrument Name: Colonoscope [id number] ?Procedure:             Colonoscopy ?Indications:           Colon cancer screening in patient at increased risk:  ?                       Colorectal cancer in mother ?Providers:             Jonathon Bellows MD, MD ?Referring MD:          Irven Easterly. Kary Kos, MD (Referring MD) ?Medicines:             Monitored Anesthesia Care ?Complications:         No immediate complications. ?Procedure:             Pre-Anesthesia Assessment: ?                       - Prior to the procedure, a History and Physical was  ?                       performed, and patient medications, allergies and  ?                       sensitivities were reviewed. The patient's tolerance  ?                       of previous anesthesia was reviewed. ?                       - The risks and benefits of the procedure and the  ?                       sedation options and risks were discussed with the  ?                       patient. All questions were answered and informed  ?                       consent was obtained. ?                       - ASA Grade Assessment: II - A patient with mild  ?                       systemic disease. ?                       After obtaining informed consent, the colonoscope was  ?                       passed under direct vision. Throughout the procedure,  ?                       the patient's blood pressure, pulse, and oxygen  ?  saturations were monitored continuously. The  ?                       Colonoscope was introduced through the anus and  ?                       advanced to the the cecum, identified by the  ?                       appendiceal orifice. The colonoscopy was performed  ?                        with ease. The patient tolerated the procedure well.  ?                       The quality of the bowel preparation was excellent. ?Findings: ?     The perianal and digital rectal examinations were normal. ?     The entire examined colon appeared normal on direct and retroflexion  ?     views. ?     Multiple small-mouthed diverticula were found in the sigmoid colon. ?     The exam was otherwise without abnormality on direct and retroflexion  ?     views. ?Impression:            - The entire examined colon is normal on direct and  ?                       retroflexion views. ?                       - Diverticulosis in the sigmoid colon. ?                       - The examination was otherwise normal on direct and  ?                       retroflexion views. ?                       - No specimens collected. ?Recommendation:        - Discharge patient to home (with escort). ?                       - Resume previous diet. ?                       - Continue present medications. ?                       - Repeat colonoscopy in 5 years for screening purposes. ?Procedure Code(s):     --- Professional --- ?                       [phone], Colonoscopy, flexible; diagnostic, including  ?                       collection of specimen(s) by brushing or washing, when  ?                       performed (separate procedure) ?Diagnosis Code(s):     ---  Professional --- ?                       Z80.0, Family history of malignant neoplasm of  ?                       digestive organs ?                       K57.30, Diverticulosis of large intestine without  ?                       perforation or abscess without bleeding ?CPT copyright 2019 [employer]. All rights reserved. ?The codes documented in this report are preliminary and upon coder review may  ?be revised to meet current compliance requirements. ?Jonathon Bellows, MD ?Jonathon Bellows MD, MD ?01/15/2022 8:07:28 AM ?This report has been signed electronically. ?Number of  Addenda: 0 ?Note Initiated On: 01/15/2022 7:50 AM ?Scope Withdrawal Time: 0 hours 7 minutes 33 seconds  ?Total Procedure Duration: 0 hours 10 minutes 23 seconds  ?Estimated Blood Loss:  Estimated blood loss: none. ?     [facility] ?

## 2022-01-15 NOTE — H&P (Signed)
? ? ? ?Jonathon Bellows, MD ?[address] [place], [place], [place], [address] ?[address] [place], [place], [place], [address] ?Phone: [phone]  ?Fax: [phone] ? ?Primary Care Physician:  Maryland Pink, MD ? ? ?Pre-Procedure History & Physical: ?HPI:  Robin Choi is a 61 y.o. female is here for an colonoscopy. ?  ?Past Medical History:  ?Diagnosis Date  ? GERD (gastroesophageal reflux disease)   ? Hypothyroidism   ? ? ?Past Surgical History:  ?Procedure Laterality Date  ? NOVASURE ABLATION    ? OTHER SURGICAL HISTORY    ? "tubal cauterization"  ? ? ?Prior to Admission medications   ?Medication Sig Start Date End Date Taking? Authorizing Provider  ?ARMOUR THYROID 60 MG tablet Take 60 mg by mouth daily. 12/05/19  Yes [provider]  ?cetirizine (ZYRTEC) 10 MG tablet Take by mouth.   Yes [provider]  ?ergocalciferol (VITAMIN D2) 1.25 MG (50000 UT) capsule Take by mouth. 11/21/21  Yes [provider]  ?almotriptan (AXERT) 12.5 MG tablet Axert 12.5 mg tablet ?Patient not taking: Reported on 01/15/2022 07/31/17   [provider]  ?Cetirizine HCl 10 MG CAPS cetirizine 10 mg capsule ?Patient not taking: Reported on 01/15/2022    [provider]  ?clobetasol cream (TEMOVATE) 0.05 % Apply to affected areas hands 1-2 times a day until rash improved. Avoid face, groin, underarms. 12/13/20   Brendolyn Patty, MD  ?diclofenac (VOLTAREN) 75 MG EC tablet Take by mouth. ?Patient not taking: Reported on 01/15/2022    [provider]  ?ketoconazole (NIZORAL) 2 % cream Apply to affected areas body folds 1-2 times a day as needed until rash improved. 12/13/20   Brendolyn Patty, MD  ?lansoprazole (PREVACID) 15 MG capsule Prevacid 15 mg capsule,delayed release ?Patient not taking: Reported on 01/15/2022    [provider]  ?tacrolimus (PROTOPIC) 0.1 % ointment Apply to affected areas body folds 1-2 times a day as needed for itch. 12/13/20   Brendolyn Patty, MD   ?topiramate (TOPAMAX) 200 MG tablet Topamax 200 mg tablet    [provider]  ?triamcinolone ointment (KENALOG) 0.1 % Apply topically. 12/17/21 12/17/22  [provider]  ?valACYclovir (VALTREX) 1000 MG tablet Valtrex 1 gram tablet ?Patient not taking: Reported on 01/02/2022 09/11/15   [provider]  ? ? ?Allergies as of 01/08/2022 - Review Complete 01/08/2022  ?Allergen Reaction Noted  ? Celecoxib Hives 05/14/2012  ? Nitrofurantoin macrocrystal Hives 01/02/2022  ? Sulfa antibiotics Hives 05/14/2012  ? Nitrofurantoin Hives 05/14/2012  ? ? ?Family History  ?Problem Relation Age of Onset  ? Diabetes Mother   ? Colon cancer Mother   ? Breast cancer Mother   ? Cancer Father   ?     carcinoma  ? Bone cancer Brother   ? Diabetes Maternal Grandmother   ? ? ?Social History  ? ?Socioeconomic History  ? Marital status: Married  ?  Spouse name: Not on file  ? Number of children: Not on file  ? Years of education: Not on file  ? Highest education level: Not on file  ?Occupational History  ? Not on file  ?Tobacco Use  ? Smoking status: Former  ?  Types: Cigarettes  ?  Quit date: 48  ?  Years since quitting: 43.3  ? Smokeless tobacco: Never  ? Tobacco comments:  ?  Quit 40 years ago  ?Vaping Use  ? Vaping Use: Never used  ?Substance and Sexual Activity  ? Alcohol use: Not Currently  ?  Comment: occational beer  ? Drug use: Never  ? Sexual activity: Not Currently  ?Other Topics Concern  ? Not on file  ?Social History Narrative  ? Not on file  ? ?Social Determinants of Health  ? ?Financial Resource Strain: Not on file  ?Food Insecurity: Not on file  ?Transportation Needs: Not on file  ?Physical Activity: Not on file  ?Stress: Not on file  ?Social Connections: Not on file  ?Intimate Partner Violence: Not on file  ? ? ?Review of Systems: ?See HPI, otherwise negative ROS ? ?Physical Exam: ?BP 134/66   Pulse 65   Temp (!) 96.3 ?F (35.7 ?C) (Temporal)   Resp 16   Ht '5\' 3"'$  (1.6 m)   Wt 72.6 kg   SpO2  100%   BMI 28.34 kg/m?  ?General:   Alert,  pleasant and cooperative in NAD ?Head:  Normocephalic and atraumatic. ?Neck:  Supple; no masses or thyromegaly. ?Lungs:  Clear throughout to auscultation, normal respiratory effort.    ?Heart:  +S1, +S2, Regular rate and rhythm, No edema. ?Abdomen:  Soft, nontender and nondistended. Normal bowel sounds, without guarding, and without rebound.   ?Neurologic:  Alert and  oriented x4;  grossly normal neurologically. ? ?Impression/Plan: ?Robin Choi is here for an colonoscopy to be performed for family history of colon cancer in mother . Risks, benefits, limitations, and alternatives regarding  colonoscopy have been reviewed with the patient.  Questions have been answered.  All parties agreeable. ? ? ?Jonathon Bellows, MD  01/15/2022, 7:48 AM ? ?

## 2022-01-16 ENCOUNTER — Encounter: Payer: Self-pay | Admitting: Gastroenterology

## 2022-01-21 ENCOUNTER — Encounter: Payer: Self-pay | Admitting: Oncology

## 2022-01-21 ENCOUNTER — Other Ambulatory Visit: Payer: Self-pay

## 2022-01-21 ENCOUNTER — Other Ambulatory Visit: Payer: Self-pay | Admitting: Oncology

## 2022-01-21 DIAGNOSIS — D75839 Thrombocytosis, unspecified: Secondary | ICD-10-CM

## 2022-01-22 ENCOUNTER — Other Ambulatory Visit: Payer: Self-pay

## 2022-01-22 ENCOUNTER — Other Ambulatory Visit
Admission: RE | Admit: 2022-01-22 | Discharge: 2022-01-22 | Disposition: A | Discharge status: Home / Self Care | Payer: 59 | Source: Ambulatory Visit | Attending: Oncology | Admitting: Oncology

## 2022-01-22 ENCOUNTER — Ambulatory Visit
Admission: RE | Admit: 2022-01-22 | Discharge: 2022-01-22 | Disposition: A | Discharge status: Home / Self Care | Payer: 59 | Source: Ambulatory Visit | Attending: Oncology | Admitting: Oncology

## 2022-01-22 ENCOUNTER — Other Ambulatory Visit: Payer: Self-pay | Admitting: Pediatrics

## 2022-01-22 ENCOUNTER — Other Ambulatory Visit: Payer: Self-pay | Admitting: Family Medicine

## 2022-01-22 ENCOUNTER — Telehealth: Payer: Self-pay | Admitting: *Deleted

## 2022-01-22 ENCOUNTER — Other Ambulatory Visit (HOSPITAL_COMMUNITY): Payer: Self-pay

## 2022-01-22 ENCOUNTER — Other Ambulatory Visit: Payer: Self-pay | Admitting: Oncology

## 2022-01-22 DIAGNOSIS — R1012 Left upper quadrant pain: Secondary | ICD-10-CM

## 2022-01-22 DIAGNOSIS — I2699 Other pulmonary embolism without acute cor pulmonale: Secondary | ICD-10-CM

## 2022-01-22 DIAGNOSIS — D75839 Thrombocytosis, unspecified: Secondary | ICD-10-CM | POA: Diagnosis present

## 2022-01-22 LAB — POCT I-STAT CREATININE: Creatinine, Ser: 1 mg/dL (ref 0.44–1.00)

## 2022-01-22 MED ORDER — APIXABAN (ELIQUIS) VTE STARTER PACK (10MG AND 5MG): ORAL_TABLET | ORAL | 0 refills | Status: DC

## 2022-01-22 MED ORDER — APIXABAN 5 MG PO TABS: ORAL_TABLET | ORAL | 0 refills | Status: DC

## 2022-01-22 MED ORDER — IOHEXOL 350 MG/ML SOLN
75.0000 mL | Freq: Once | INTRAVENOUS | Status: AC | PRN
  Administered 2022-01-22: 75 mL via INTRAVENOUS

## 2022-01-22 NOTE — Telephone Encounter (Signed)
Called to inform patient of CT findings. Per Dr. Tasia Catchings, CT shows small blood clots in lungs. Dr. Tasia Catchings wanting to start patient on Eliquis 10 mg BID for 7 days, then '5mg'$  BID. Per Dr. Tasia Catchings would like patient to have [place] of lower extremeties done STAT. She would also like patient to have labs done ASAP. Patient to keep same appt with Dr. Tasia Catchings for 5/9. Advised patient to go to ED if she starts to experience any SOB or diffculty breathing. Patient verbalized understanding. ? ? ? ?Please schedule patient for: ?[place] lower extremities STAT ?Lab appt tomorrow.  ?Keep appt with Dr. Tasia Catchings on 5/9 as is.  ?Please notify patient of appt.  ?

## 2022-01-22 NOTE — Telephone Encounter (Signed)
[facility] called to report that patient results are in the chart. ?

## 2022-01-22 NOTE — Addendum Note (Signed)
Addended by: Darl Pikes on: 01/22/2022 04:12 PM ? ? Modules accepted: Orders ? ?

## 2022-01-23 ENCOUNTER — Inpatient Hospital Stay: Payer: 59 | Attending: Oncology

## 2022-01-23 DIAGNOSIS — I2699 Other pulmonary embolism without acute cor pulmonale: Secondary | ICD-10-CM | POA: Insufficient documentation

## 2022-01-23 DIAGNOSIS — R102 Pelvic and perineal pain: Secondary | ICD-10-CM | POA: Diagnosis not present

## 2022-01-23 DIAGNOSIS — R519 Headache, unspecified: Secondary | ICD-10-CM | POA: Insufficient documentation

## 2022-01-23 DIAGNOSIS — R109 Unspecified abdominal pain: Secondary | ICD-10-CM | POA: Diagnosis not present

## 2022-01-23 DIAGNOSIS — Z808 Family history of malignant neoplasm of other organs or systems: Secondary | ICD-10-CM | POA: Diagnosis not present

## 2022-01-23 DIAGNOSIS — K5790 Diverticulosis of intestine, part unspecified, without perforation or abscess without bleeding: Secondary | ICD-10-CM | POA: Insufficient documentation

## 2022-01-23 DIAGNOSIS — Z7901 Long term (current) use of anticoagulants: Secondary | ICD-10-CM | POA: Diagnosis not present

## 2022-01-23 DIAGNOSIS — R42 Dizziness and giddiness: Secondary | ICD-10-CM | POA: Insufficient documentation

## 2022-01-23 DIAGNOSIS — Z800 Family history of malignant neoplasm of digestive organs: Secondary | ICD-10-CM | POA: Diagnosis not present

## 2022-01-23 DIAGNOSIS — Z803 Family history of malignant neoplasm of breast: Secondary | ICD-10-CM | POA: Diagnosis not present

## 2022-01-23 DIAGNOSIS — D75839 Thrombocytosis, unspecified: Secondary | ICD-10-CM | POA: Diagnosis not present

## 2022-01-23 DIAGNOSIS — R634 Abnormal weight loss: Secondary | ICD-10-CM | POA: Diagnosis not present

## 2022-01-23 DIAGNOSIS — Z87891 Personal history of nicotine dependence: Secondary | ICD-10-CM | POA: Insufficient documentation

## 2022-01-23 NOTE — Telephone Encounter (Signed)
Informed pt that MD requests to move up appt and for it to be in person. Pt verbalized understanding . ? ?Please contact pt to schedule MD only on 5/5 (ok to double book). Cancel appt 5/8.  ? ?

## 2022-01-23 NOTE — Telephone Encounter (Signed)
Patient in clinic today for labs and requested to speak to someone from Dr. Collie Siad team.  ? ?Spoke to pt and informed her of [place] extemity results, which was negative for DVT. She wanted to know if her spleen US could be moved to today, but she was informed that she would need to contact PCP to have scan moved up since they have it authorized. She vebalized understading and stated she would keep it as scheduled (tomorrow). She also voiced that she had a headache and since she could not take aspirin/ nsaid, what would  be best to take. Advised her that Tylenol would be ok to take for headache. Pt wanted to know if the spleen could be seen on her CT, but she was infomed that the scan was specific to the test. She was very concerned about taking blood thinner, advised her to continue taking as prescribed until she saw Dr. Tasia Catchings. Informed pt that Dr. Tasia Catchings would like to see her sooner and in person. Pt verbalized unerstanding.  ? ?

## 2022-01-24 ENCOUNTER — Ambulatory Visit
Admission: RE | Admit: 2022-01-24 | Discharge: 2022-01-24 | Disposition: A | Discharge status: Home / Self Care | Payer: 59 | Source: Ambulatory Visit | Attending: Family Medicine | Admitting: Family Medicine

## 2022-01-24 DIAGNOSIS — R1012 Left upper quadrant pain: Secondary | ICD-10-CM | POA: Insufficient documentation

## 2022-01-25 ENCOUNTER — Inpatient Hospital Stay (HOSPITAL_BASED_OUTPATIENT_CLINIC_OR_DEPARTMENT_OTHER): Payer: 59 | Admitting: Oncology

## 2022-01-25 ENCOUNTER — Ambulatory Visit
Admission: RE | Admit: 2022-01-25 | Discharge: 2022-01-25 | Disposition: A | Discharge status: Home / Self Care | Payer: 59 | Source: Ambulatory Visit | Attending: Oncology | Admitting: Oncology

## 2022-01-25 VITALS — BP 109/73 | HR 75 | Resp 16 | Ht 63.0 in | Wt 160.7 lb

## 2022-01-25 DIAGNOSIS — R519 Headache, unspecified: Secondary | ICD-10-CM | POA: Diagnosis not present

## 2022-01-25 DIAGNOSIS — D75839 Thrombocytosis, unspecified: Secondary | ICD-10-CM

## 2022-01-25 DIAGNOSIS — R1030 Lower abdominal pain, unspecified: Secondary | ICD-10-CM | POA: Diagnosis not present

## 2022-01-25 DIAGNOSIS — R634 Abnormal weight loss: Secondary | ICD-10-CM

## 2022-01-25 DIAGNOSIS — I2699 Other pulmonary embolism without acute cor pulmonale: Secondary | ICD-10-CM

## 2022-01-25 LAB — MPL MUTATION ANALYSIS

## 2022-01-25 NOTE — Progress Notes (Signed)
?Hematology/Oncology Consult note ?[phone] [phone] ?  ? ?   ? ? ?Patient Care Team: ?Maryland Pink, MD as PCP - General (Family Medicine) ?Arnetha Courser, MD (Family Medicine) ? ?REFERRING PROVIDER: ?Lada, Satira Anis, MD  ?CHIEF COMPLAINTS/REASON FOR VISIT:  ?Evaluation of thrombocytosis ? ?HISTORY OF PRESENTING ILLNESS:  ? ?Robin Choi is a  61 y.o.  female with PMH listed below was seen in consultation at the request of  Lada, Satira Anis, MD  for evaluation of thrombocytosis ? ?For the past few months, patient reports lower abdominal pressure, dysuria symptoms.  She feels like "sitting on a ball" ? patient has had evaluation by PCP and gynecology.  Patient reports frequent history of UTIs in the past. ?Patient had negative Pap smear, wet prep and ultrasound done by Dr. Chrystine Oiler. ?Dr. Leafy Ro reviewed the ultrasound results and images with patient.  No abnormal findings.  Her bimanual examination revealed cervical tenderness, uterine tenderness and minimal obturator tenderness and +bilateral pudendal nerves were tender.  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 ? ?12/11/2021, CBC showed a platelet count of.  804,000. ?12/17/2021, platelet count 795,000 ?12/27/2021, platelet count 872,000. ?Patient reports 5 pound weight loss since March 2023.  She has good appetite.  Denies night sweats or fever. ? ?Patient reports having life stressors.  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  She has to lift him multiple times in order to provide care.  She lives with her husband and brother-in-law.  Husband is on disability and not able to lift patient ? ?Family history positive for father with carcinoma discovered in his neck, details unknown, breast cancer, colon cancer. ?She is overdue for colonoscopy.  She feels she could not  get anyone to take care of brother-in-law during weekdays. ? ? ?INTERVAL HISTORY ?Robin Choi is a 61 y.o. female who has above history reviewed by me today presents for follow up visit for acute pulmonary embolism, thrombocytosis  ? ?01/22/2022 CT chest angiogram was obtained for work up of left side pain below rib cage. CT showed Small volume nonocclusive pulmonary embolism in a posterior basilar left lower lobe pulmonary artery branch ? ?Patient was recommended to start Eliquis '10mg'$  BID x 7 days followed by '5mg'$  BID.  ? ?She has multiple complaints today.  ?Chronic abdomen/pelvic pressure with stabbing burning sensation.  ?+ headache, history of migraine. + dizziness when bending over. Denies any focal deficit  ?No acute bleeding event.  ?Left side pain has improved after started on Eliquis.  ?Lost 5 pounds since last visit.  ? ? ?Review of Systems  ?Constitutional:  Negative for appetite change, chills, fatigue and fever.  ?HENT:   Negative for hearing loss and voice change.   ?Eyes:  Negative for eye problems.  ?Respiratory:  Negative for chest tightness and cough.   ?Cardiovascular:  Negative for chest pain.  ?Gastrointestinal:  Negative for abdominal distention and blood in stool.  ?     Abdomen/pelvic pain,   ?Endocrine: Negative for hot flashes.  ?Genitourinary:  Negative for difficulty urinating and frequency.   ?Musculoskeletal:  Negative for arthralgias.  ?Skin:  Negative for itching and rash.  ?Neurological:  Positive for dizziness and headaches. Negative for extremity weakness.  ?Hematological:  Negative for adenopathy.  ?Psychiatric/Behavioral:  Negative for confusion.   ? ?MEDICAL HISTORY:  ?Past Medical History:  ?  Diagnosis Date  ? GERD (gastroesophageal reflux disease)   ? Hypothyroidism   ? ? ?SURGICAL HISTORY: ?Past Surgical History:  ?Procedure Laterality Date  ? COLONOSCOPY WITH PROPOFOL N/A 01/15/2022  ? Procedure: COLONOSCOPY WITH PROPOFOL;  Surgeon: Jonathon Bellows, MD;  Location: [facility]  ENDOSCOPY;  Service: Gastroenterology;  Laterality: N/A;  Needs an early appointment.  Has disabled brother and easier to get sitter early in the morning.  ? NOVASURE ABLATION    ? OTHER SURGICAL HISTORY    ? "tubal cauterization"  ? ? ?SOCIAL HISTORY: ?Social History  ? ?Socioeconomic History  ? Marital status: Married  ?  Spouse name: Not on file  ? Number of children: Not on file  ? Years of education: Not on file  ? Highest education level: Not on file  ?Occupational History  ? Not on file  ?Tobacco Use  ? Smoking status: Former  ?  Types: Cigarettes  ?  Quit date: 67  ?  Years since quitting: 43.3  ? Smokeless tobacco: Never  ? Tobacco comments:  ?  Quit 40 years ago  ?Vaping Use  ? Vaping Use: Never used  ?Substance and Sexual Activity  ? Alcohol use: Not Currently  ?  Comment: occational beer  ? Drug use: Never  ? Sexual activity: Not Currently  ?Other Topics Concern  ? Not on file  ?Social History Narrative  ? Not on file  ? ?Social Determinants of Health  ? ?Financial Resource Strain: Not on file  ?Food Insecurity: Not on file  ?Transportation Needs: Not on file  ?Physical Activity: Not on file  ?Stress: Not on file  ?Social Connections: Not on file  ?Intimate Partner Violence: Not on file  ? ? ?FAMILY HISTORY: ?Family History  ?Problem Relation Age of Onset  ? Diabetes Mother   ? Colon cancer Mother   ? Breast cancer Mother   ? Cancer Father   ?     carcinoma  ? Bone cancer Brother   ? Diabetes Maternal Grandmother   ? ? ?ALLERGIES:  is allergic to celecoxib, nitrofurantoin macrocrystal, nitrofurantoin, and sulfa antibiotics. ? ?MEDICATIONS:  ?Current Outpatient Medications  ?Medication Sig Dispense Refill  ? apixaban (ELIQUIS) 5 MG TABS tablet Take 2 tablets ('10mg'$  total) by mouth twice daily for 7 days, then 1 tablet ('5mg'$ ) twice daily (continue on the 5 mg twice daily dosing) 74 tablet 0  ? ARMOUR THYROID 60 MG tablet Take 60 mg by mouth daily.    ? cetirizine (ZYRTEC) 10 MG tablet Take by mouth.    ?  Cetirizine HCl 10 MG CAPS     ? clobetasol cream (TEMOVATE) 0.05 % Apply to affected areas hands 1-2 times a day until rash improved. Avoid face, groin, underarms. 30 g 1  ? diclofenac (VOLTAREN) 75 MG EC tablet Take by mouth.    ? ergocalciferol (VITAMIN D2) 1.25 MG (50000 UT) capsule Take by mouth.    ? ketoconazole (NIZORAL) 2 % cream Apply to affected areas body folds 1-2 times a day as needed until rash improved. 60 g 2  ? lansoprazole (PREVACID) 15 MG capsule     ? tacrolimus (PROTOPIC) 0.1 % ointment Apply to affected areas body folds 1-2 times a day as needed for itch. 60 g 2  ? topiramate (TOPAMAX) 200 MG tablet Topamax 200 mg tablet    ? triamcinolone ointment (KENALOG) 0.1 % Apply topically.    ? valACYclovir (VALTREX) 1000 MG tablet     ? almotriptan (AXERT) 12.5 MG  tablet Axert 12.5 mg tablet (Patient not taking: Reported on 01/25/2022)    ? ?No current facility-administered medications for this visit.  ? ? ? ?PHYSICAL EXAMINATION: ?ECOG PERFORMANCE STATUS: 1 - Symptomatic but completely ambulatory ?Vitals:  ? 01/25/22 1027  ?BP: 109/73  ?Pulse: 75  ?Resp: 16  ?SpO2: 99%  ? ?Filed Weights  ? 01/25/22 1027  ?Weight: 160 lb 11.2 oz (72.9 kg)  ? ? ?Physical Exam ?Constitutional:   ?   General: She is not in acute distress. ?HENT:  ?   Head: Normocephalic and atraumatic.  ?Eyes:  ?   General: No scleral icterus. ?Cardiovascular:  ?   Rate and Rhythm: Normal rate.  ?Pulmonary:  ?   Effort: Pulmonary effort is normal. No respiratory distress.  ?Abdominal:  ?   General: There is no distension.  ?Musculoskeletal:     ?   General: No deformity. Normal range of motion.  ?   Cervical back: Normal range of motion and neck supple.  ?Skin: ?   General: Skin is warm and dry.  ?   Findings: No erythema or rash.  ?Neurological:  ?   Mental Status: She is alert and oriented to person, place, and time. Mental status is at baseline.  ?   Cranial Nerves: No cranial nerve deficit.  ?   Coordination: Coordination normal.   ? ? ?LABORATORY DATA:  ?I have reviewed the data as listed ?Lab Results  ?Component Value Date  ? WBC 6.4 01/02/2022  ? HGB 13.2 01/02/2022  ? HCT 40.8 01/02/2022  ? MCV 90.5 01/02/2022  ? PLT 827 (H) 04/12/202

## 2022-01-25 NOTE — Progress Notes (Signed)
Pt c/o dizziness when bending over, intermittent headache with visual changes. Pt asking about whole body scan to determine if blood clots could be anywhere else in her body. ?

## 2022-01-28 ENCOUNTER — Ambulatory Visit (INDEPENDENT_AMBULATORY_CARE_PROVIDER_SITE_OTHER): Payer: 59 | Admitting: Urology

## 2022-01-28 VITALS — BP 115/78 | HR 70 | Ht 63.0 in | Wt 160.0 lb

## 2022-01-28 DIAGNOSIS — R300 Dysuria: Secondary | ICD-10-CM

## 2022-01-28 DIAGNOSIS — D75839 Thrombocytosis, unspecified: Secondary | ICD-10-CM | POA: Diagnosis not present

## 2022-01-28 DIAGNOSIS — N3946 Mixed incontinence: Secondary | ICD-10-CM

## 2022-01-28 DIAGNOSIS — R351 Nocturia: Secondary | ICD-10-CM | POA: Diagnosis not present

## 2022-01-28 LAB — URINALYSIS, COMPLETE
Bilirubin, UA: NEGATIVE
Glucose, UA: NEGATIVE
Ketones, UA: NEGATIVE
Leukocytes,UA: NEGATIVE
Nitrite, UA: NEGATIVE
Protein,UA: NEGATIVE
Specific Gravity, UA: >1.03 — ABNORMAL HIGH (ref 1.005–1.030)
Urobilinogen, Ur: 0.2 mg/dL (ref 0.2–1.0)
pH, UA: 6 (ref 5.0–7.5)

## 2022-01-28 LAB — MICROSCOPIC EXAMINATION

## 2022-01-28 MED ORDER — ESTRADIOL 0.1 MG/GM VA CREA: TOPICAL_CREAM | VAGINAL | 2 refills | Status: DC

## 2022-01-28 NOTE — Patient Instructions (Signed)

## 2022-01-28 NOTE — Progress Notes (Signed)
? ?01/28/2022 ?9:16 AM  ? ?Robin Choi ?December 08, 1960 ?[id number] ? ?Referring provider: Earlie Server, MD ?[place] [address] ? ?Chief Complaint  ?Patient presents with  ? Dysuria  ? ? ?HPI: ?I was consulted to assess the patient's vaginal burning.  She had low abdominal pain and burning in March.  Azo Standard did not help.  She describes a negative culture and high platelet count and a negative recent Pap smear.  She has had cryotherapy for previous dysplasia.  She describes a negative pelvic ultrasound by gynecology and negative colonoscopy recently.  She was recently found to have a pulmonary embolism in the cancer center. ? ?She feels burning in the vagina all the time but now it comes and goes.  It may or may not be lessened by voiding.  She does not feel she is aware underclothes very well.  The symptoms were nonspecific and improving minimally ? ?She normally voids every 1-2 hours gets up 3 times a night.  She feels she has to go more often.  She leaks with coughing sneezing sometimes with urgency.  Sometimes she has foot on the floor syndrome.  She does not wear a pad. ? ?She thinks she gets 2 or 3 bladder infections year but was nonspecific ? ?She is scheduled to have a CT scan of the abdomen pelvis Feb 01, 2022 ? ?No history of bladder surgery kidney stones.  No neurologic issues ? ? ?PMH: ?Past Medical History:  ?Diagnosis Date  ? GERD (gastroesophageal reflux disease)   ? Hypothyroidism   ? ? ?Surgical History: ?Past Surgical History:  ?Procedure Laterality Date  ? COLONOSCOPY WITH PROPOFOL N/A 01/15/2022  ? Procedure: COLONOSCOPY WITH PROPOFOL;  Surgeon: Jonathon Bellows, MD;  Location: [facility] ENDOSCOPY;  Service: Gastroenterology;  Laterality: N/A;  Needs an early appointment.  Has disabled brother and easier to get sitter early in the morning.  ? NOVASURE ABLATION    ? OTHER SURGICAL HISTORY    ? "tubal cauterization"  ? ? ?Home Medications:  ?Allergies as of 01/28/2022   ? ?    Reactions  ? Celecoxib Hives  ? Nitrofurantoin Macrocrystal Hives  ? Nitrofurantoin Hives  ? Sulfa Antibiotics Hives  ? ?  ? ?  ?Medication List  ?  ? ?  ? Accurate as of Jan 28, 2022  9:16 AM. If you have any questions, ask your nurse or doctor.  ?  ?  ? ?  ? ?almotriptan 12.5 MG tablet ?Commonly known as: AXERT ?  ?apixaban 5 MG Tabs tablet ?Commonly known as: ELIQUIS ?Take 2 tablets ('10mg'$  total) by mouth twice daily for 7 days, then 1 tablet ('5mg'$ ) twice daily (continue on the 5 mg twice daily dosing) ?  ?Armour Thyroid 60 MG tablet ?Generic drug: thyroid ?Take 60 mg by mouth daily. ?  ?cetirizine 10 MG tablet ?Commonly known as: ZYRTEC ?Take by mouth. ?What changed: Another medication with the same name was removed. Continue taking this medication, and follow the directions you see here. ?  ?clobetasol cream 0.05 % ?Commonly known as: TEMOVATE ?Apply to affected areas hands 1-2 times a day until rash improved. Avoid face, groin, underarms. ?  ?diclofenac 75 MG EC tablet ?Commonly known as: VOLTAREN ?Take by mouth. ?  ?ergocalciferol 1.25 MG (50000 UT) capsule ?Commonly known as: VITAMIN D2 ?Take by mouth. ?  ?ketoconazole 2 % cream ?Commonly known as: NIZORAL ?Apply to affected areas body folds 1-2 times a day as needed until rash improved. ?  ?  lansoprazole 15 MG capsule ?Commonly known as: PREVACID ?  ?Na Sulfate-K Sulfate-Mg Sulf 17.5-3.13-1.6 GM/177ML Soln ?See admin instructions. ?  ?tacrolimus 0.1 % ointment ?Commonly known as: PROTOPIC ?Apply to affected areas body folds 1-2 times a day as needed for itch. ?  ?topiramate 200 MG tablet ?Commonly known as: TOPAMAX ?Topamax 200 mg tablet ?  ?topiramate 50 MG tablet ?Commonly known as: TOPAMAX ?Take 50 mg by mouth 2 (two) times daily. ?  ?triamcinolone ointment 0.1 % ?Commonly known as: KENALOG ?Apply topically. ?  ?valACYclovir 1000 MG tablet ?Commonly known as: VALTREX ?  ? ?  ? ? ?Allergies:  ?Allergies  ?Allergen Reactions  ? Celecoxib Hives  ?  Nitrofurantoin Macrocrystal Hives  ? Nitrofurantoin Hives  ? Sulfa Antibiotics Hives  ? ? ?Family History: ?Family History  ?Problem Relation Age of Onset  ? Diabetes Mother   ? Colon cancer Mother   ? Breast cancer Mother   ? Cancer Father   ?     carcinoma  ? Bone cancer Brother   ? Diabetes Maternal Grandmother   ? ? ?Social History:  reports that she quit smoking about 43 years ago. Her smoking use included cigarettes. She has never used smokeless tobacco. She reports that she does not currently use alcohol. She reports that she does not use drugs. ? ?ROS: ?  ? ?  ? ?  ? ?  ? ?  ? ?  ? ?  ? ?  ? ?  ? ?  ? ?  ? ?  ? ?  ? ?Physical Exam: ?There were no vitals taken for this visit.  ?Constitutional:  Alert and oriented, No acute distress. ?HEENT: [place] AT, moist mucus membranes.  Trachea midline, no masses. ?Cardiovascular: No clubbing, cyanosis, or edema. ?Respiratory: Normal respiratory effort, no increased work of breathing. ?GI: Abdomen is soft, nontender, nondistended, no abdominal masses ?GU: On pelvic examination patient had grade 1 cystocele that was high in the vaginal vault.  Tissues looked mildly inflamed.  There was a mild elevation just under the mid urethra to the right of the midline and in my opinion is not a diverticulum.  No stress incontinence. ?Skin: No rashes, bruises or suspicious lesions. ?Lymph: No cervical or inguinal adenopathy. ?Neurologic: Grossly intact, no focal deficits, moving all 4 extremities. ?Psychiatric: Normal mood and affect. ? ?Laboratory Data: ?Lab Results  ?Component Value Date  ? WBC 6.4 01/02/2022  ? HGB 13.2 01/02/2022  ? HCT 40.8 01/02/2022  ? MCV 90.5 01/02/2022  ? PLT 827 (H) 01/02/2022  ? ? ?Lab Results  ?Component Value Date  ? CREATININE 1.00 01/22/2022  ? ? ?No results found for: PSA ? ?No results found for: TESTOSTERONE ? ?No results found for: HGBA1C ? ?Urinalysis ?No results found for: COLORURINE, APPEARANCEUR, [place], [place], [place], [place], [place],  KETONESUR, PROTEINUR, [place], NITRITE, LEUKOCYTESUR ? ?Pertinent Imaging: ?Urine negative.  Urine sent for culture.  Chart reviewed ? ?Assessment & Plan: Patient has nonspecific vaginal burning.  It came on fairly acutely in March.  She has been investigated for possible cancer.  She has mild frequency nocturia and mixed incontinence.  CT scan is pending ? ?The patient's symptoms could be due to vaginal dryness.  The role of estrogen cream described.  If she stayed on estrogen she would need long-term follow-up by gynecology.  Reassess in approximately 6 weeks for safety cystoscopy. ? ?She understands that it would be rare for there to be a genitourinary cause of the elevated platelet count.  She is getting a CT scan will come back for cystoscopy.  Estrogen cream sent 3 times a week for the next 4 weeks and once a week after.  She had reported vaginal dryness and pain with intercourse before this event and has been on estrogen before from a compound pharmacy ? ?1. Thrombocytosis ? ?- CT Angio Chest Pulmonary Embolism (PE) W or WO Contrast ? ?2. Dysuria ? ?- Urinalysis, Complete ? ? ?No follow-ups on file. ? ?Reece Packer, MD ? ?[facility] ?[address] ?[place], [place] [address] ?[phone] ?  ?

## 2022-01-29 ENCOUNTER — Telehealth: Payer: 59 | Admitting: Oncology

## 2022-01-29 LAB — FACTOR 5 LEIDEN

## 2022-01-29 LAB — MISC LABCORP TEST (SEND OUT): Labcorp test code: 489450

## 2022-01-30 LAB — PROTHROMBIN GENE MUTATION

## 2022-01-31 LAB — CULTURE, URINE COMPREHENSIVE

## 2022-02-01 ENCOUNTER — Ambulatory Visit
Admission: RE | Admit: 2022-02-01 | Discharge: 2022-02-01 | Disposition: A | Discharge status: Home / Self Care | Payer: 59 | Source: Ambulatory Visit | Attending: Oncology | Admitting: Oncology

## 2022-02-01 DIAGNOSIS — R1030 Lower abdominal pain, unspecified: Secondary | ICD-10-CM | POA: Diagnosis present

## 2022-02-01 MED ORDER — IOHEXOL 300 MG/ML  SOLN
75.0000 mL | Freq: Once | INTRAMUSCULAR | Status: AC | PRN
Start: 2022-02-01 — End: 2022-02-01
  Administered 2022-02-01: 75 mL via INTRAVENOUS

## 2022-02-05 ENCOUNTER — Other Ambulatory Visit: Payer: Self-pay | Admitting: Oncology

## 2022-02-05 DIAGNOSIS — D75839 Thrombocytosis, unspecified: Secondary | ICD-10-CM

## 2022-02-06 ENCOUNTER — Encounter: Payer: Self-pay | Admitting: Oncology

## 2022-02-06 ENCOUNTER — Encounter: Payer: Self-pay | Admitting: Urology

## 2022-02-06 ENCOUNTER — Telehealth: Payer: Self-pay

## 2022-02-06 DIAGNOSIS — D75839 Thrombocytosis, unspecified: Secondary | ICD-10-CM

## 2022-02-06 NOTE — Telephone Encounter (Signed)
Patient informed of results via [facility].  ? ?Please schedule patient for lab/MD (cbc,cmp, ESR, CRP) in 4 weeks and inform pt of appt details. Thanks  ?

## 2022-02-06 NOTE — Telephone Encounter (Signed)
-----  Message from Earlie Server, MD sent at 02/05/2022 11:14 PM EDT ----- ?Let her know that CT scan did not show any findings to explain her abdominal pain.  ?Her blood work up results are all good. Increased platelet can be reactive.  ?Continue blood thinner.  ?Recommend follow up, lab cbc cmp ESR, CRP, MD visit same day. In 4 weeks.  ?

## 2022-02-13 ENCOUNTER — Encounter: Payer: Self-pay | Admitting: Oncology

## 2022-02-13 NOTE — Telephone Encounter (Signed)
Please advise 

## 2022-02-21 ENCOUNTER — Other Ambulatory Visit: Payer: Self-pay | Admitting: *Deleted

## 2022-02-21 DIAGNOSIS — I2699 Other pulmonary embolism without acute cor pulmonale: Secondary | ICD-10-CM

## 2022-02-21 MED ORDER — APIXABAN 5 MG PO TABS: ORAL_TABLET | ORAL | 1 refills | Status: DC

## 2022-03-06 ENCOUNTER — Encounter: Payer: Self-pay | Admitting: Oncology

## 2022-03-06 ENCOUNTER — Other Ambulatory Visit: Payer: Self-pay

## 2022-03-06 ENCOUNTER — Inpatient Hospital Stay: Payer: 59 | Attending: Oncology

## 2022-03-06 ENCOUNTER — Inpatient Hospital Stay (HOSPITAL_BASED_OUTPATIENT_CLINIC_OR_DEPARTMENT_OTHER): Payer: 59 | Admitting: Oncology

## 2022-03-06 DIAGNOSIS — Z809 Family history of malignant neoplasm, unspecified: Secondary | ICD-10-CM

## 2022-03-06 DIAGNOSIS — I2699 Other pulmonary embolism without acute cor pulmonale: Secondary | ICD-10-CM | POA: Insufficient documentation

## 2022-03-06 DIAGNOSIS — Z7901 Long term (current) use of anticoagulants: Secondary | ICD-10-CM | POA: Diagnosis not present

## 2022-03-06 DIAGNOSIS — Z87442 Personal history of urinary calculi: Secondary | ICD-10-CM | POA: Diagnosis not present

## 2022-03-06 DIAGNOSIS — Z800 Family history of malignant neoplasm of digestive organs: Secondary | ICD-10-CM | POA: Insufficient documentation

## 2022-03-06 DIAGNOSIS — Z803 Family history of malignant neoplasm of breast: Secondary | ICD-10-CM | POA: Diagnosis not present

## 2022-03-06 DIAGNOSIS — G43809 Other migraine, not intractable, without status migrainosus: Secondary | ICD-10-CM | POA: Diagnosis not present

## 2022-03-06 DIAGNOSIS — D75839 Thrombocytosis, unspecified: Secondary | ICD-10-CM

## 2022-03-06 DIAGNOSIS — Z808 Family history of malignant neoplasm of other organs or systems: Secondary | ICD-10-CM | POA: Diagnosis not present

## 2022-03-06 DIAGNOSIS — Z86711 Personal history of pulmonary embolism: Secondary | ICD-10-CM | POA: Insufficient documentation

## 2022-03-06 DIAGNOSIS — Z87891 Personal history of nicotine dependence: Secondary | ICD-10-CM | POA: Diagnosis not present

## 2022-03-06 DIAGNOSIS — G43909 Migraine, unspecified, not intractable, without status migrainosus: Secondary | ICD-10-CM | POA: Insufficient documentation

## 2022-03-06 LAB — CBC WITH DIFFERENTIAL/PLATELET
Abs Immature Granulocytes: 0.02 10*3/uL (ref 0.00–0.07)
Basophils Absolute: 0.1 10*3/uL (ref 0.0–0.1)
Basophils Relative: 1 %
Eosinophils Absolute: 0.3 10*3/uL (ref 0.0–0.5)
Eosinophils Relative: 4 %
HCT: 38.9 % (ref 36.0–46.0)
Hemoglobin: 12.6 g/dL (ref 12.0–15.0)
Immature Granulocytes: 0 %
Lymphocytes Relative: 23 %
Lymphs Abs: 1.8 10*3/uL (ref 0.7–4.0)
MCH: 29 pg (ref 26.0–34.0)
MCHC: 32.4 g/dL (ref 30.0–36.0)
MCV: 89.4 fL (ref 80.0–100.0)
Monocytes Absolute: 0.6 10*3/uL (ref 0.1–1.0)
Monocytes Relative: 8 %
Neutro Abs: 5 10*3/uL (ref 1.7–7.7)
Neutrophils Relative %: 64 %
Platelets: 633 10*3/uL — ABNORMAL HIGH (ref 150–400)
RBC: 4.35 MIL/uL (ref 3.87–5.11)
RDW: 13 % (ref 11.5–15.5)
WBC: 7.9 10*3/uL (ref 4.0–10.5)
nRBC: 0 % (ref 0.0–0.2)

## 2022-03-06 LAB — COMPREHENSIVE METABOLIC PANEL
ALT: 18 U/L (ref 0–44)
AST: 18 U/L (ref 15–41)
Albumin: 4.2 g/dL (ref 3.5–5.0)
Alkaline Phosphatase: 48 U/L (ref 38–126)
Anion gap: 6 (ref 5–15)
BUN: 13 mg/dL (ref 8–23)
CO2: 24 mmol/L (ref 22–32)
Calcium: 9.1 mg/dL (ref 8.9–10.3)
Chloride: 108 mmol/L (ref 98–111)
Creatinine, Ser: 0.78 mg/dL (ref 0.44–1.00)
GFR, Estimated: >60 mL/min (ref >60)
Glucose, Bld: 95 mg/dL (ref 70–99)
Potassium: 3.9 mmol/L (ref 3.5–5.1)
Sodium: 138 mmol/L (ref 135–145)
Total Bilirubin: 0.6 mg/dL (ref 0.3–1.2)
Total Protein: 7.7 g/dL (ref 6.5–8.1)

## 2022-03-06 LAB — SEDIMENTATION RATE: Sed Rate: 18 mm/hr (ref 0–30)

## 2022-03-06 LAB — C-REACTIVE PROTEIN: CRP: 1 mg/dL — ABNORMAL HIGH (ref <1.0)

## 2022-03-06 MED ORDER — APIXABAN 5 MG PO TABS: ORAL_TABLET | ORAL | 3 refills | Status: DC

## 2022-03-06 NOTE — Progress Notes (Signed)
Hematology/Oncology Consult note [phone] [phone]         Patient Care Team: [place] Pink, MD as PCP - General (Family Medicine) Lada, Satira Anis, MD (Family Medicine)   ASSESSMENT & PLAN:  History of pulmonary embolism Unprovoked, negative factor V Leiden mutation.  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 Likely reactive.  Labs reviewed and discussed with patient.  Platelet count trended down. Check CRP, ESR.  Migraine CT head without contrast did not show any acute intracranial changes. Recommend patient to follow-up with primary care provider for management.  Family history of cancer Patient declined genetic testing. Orders Placed This Encounter  Procedures   CBC with Differential/Platelet    Standing Status:   Future    Standing Expiration Date:   03/07/2023   Comprehensive metabolic panel    Standing Status:   Future    Standing Expiration Date:   03/06/2023   Follow-up in 3 months. All questions were answered. The patient knows to call the clinic with any problems, questions or concerns.  Earlie Server, MD, PhD [facility] Hematology Oncology 03/06/2022      CHIEF COMPLAINTS/REASON FOR VISIT:  Evaluation of thrombocytosis  HISTORY OF PRESENTING ILLNESS:   Robin Choi is a  61 y.o.  female with PMH listed below was seen in consultation at the request of  Maryland Pink, MD  for evaluation of thrombocytosis  For the past few months, patient reports lower abdominal pressure, dysuria symptoms.  She feels like "sitting on a ball"  patient has had evaluation by PCP and gynecology.  Patient reports frequent history of UTIs in the past. Patient had negative Pap smear, wet prep and ultrasound done by Dr. Chrystine Oiler. Dr. Leafy Ro reviewed the ultrasound results and images with patient.  No  abnormal findings.  Her bimanual examination revealed cervical tenderness, uterine tenderness and minimal obturator tenderness and +bilateral pudendal nerves were tender.  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  12/11/2021, CBC showed a platelet count of.  804,000. 12/17/2021, platelet count 795,000 12/27/2021, platelet count 872,000. Patient reports 5 pound weight loss since March 2023.  She has good appetite.  Denies night sweats or fever.  Patient reports having life stressors.  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  She has to lift him multiple times in order to provide care.  She lives with her husband and brother-in-law.  Husband is on disability and not able to lift patient  Family history positive for father with carcinoma discovered in his neck, details unknown, breast cancer, colon cancer. She is overdue for colonoscopy.  She feels she could not get anyone to take care of brother-in-law during weekdays.  01/22/2022 CT chest angiogram was obtained for work up of left side pain below rib cage. CT showed Small volume nonocclusive pulmonary embolism in a posterior basilar left lower lobe pulmonary artery branch INTERVAL HISTORY Robin Choi is a 61 y.o. female who has above history reviewed by me today presents for follow up visit for acute pulmonary embolism, thrombocytosis    Chronic abdomen/pelvic pressure with stabbing burning sensation.  + headache, history of migraine. + dizziness when bending over. Denies any focal deficit    Review of Systems  Constitutional:  Negative for appetite change, chills, fatigue and fever.  HENT:   Negative for hearing loss and voice change.   Eyes:  Negative for eye problems.  Respiratory:  Negative for chest tightness and cough.   Cardiovascular:  Negative for chest pain.   Gastrointestinal:  Negative for abdominal distention and blood in stool.       Abdomen/pelvic pain,   Endocrine: Negative for hot flashes.  Genitourinary:  Negative for difficulty urinating and frequency.   Musculoskeletal:  Negative for arthralgias.  Skin:  Negative for itching and rash.  Neurological:  Positive for dizziness and headaches. Negative for extremity weakness.  Hematological:  Negative for adenopathy.  Psychiatric/Behavioral:  Negative for confusion.     MEDICAL HISTORY:  Past Medical History:  Diagnosis Date   GERD (gastroesophageal reflux disease)    Hypothyroidism     SURGICAL HISTORY: Past Surgical History:  Procedure Laterality Date   COLONOSCOPY WITH PROPOFOL N/A 01/15/2022   Procedure: COLONOSCOPY WITH PROPOFOL;  Surgeon: Jonathon Bellows, MD;  Location: [facility] ENDOSCOPY;  Service: Gastroenterology;  Laterality: N/A;  Needs an early appointment.  Has disabled brother and easier to get sitter early in the morning.   NOVASURE ABLATION     OTHER SURGICAL HISTORY     "tubal cauterization"    SOCIAL HISTORY: Social History   Socioeconomic History   Marital status: Married    Spouse name: Not on file   Number of children: Not on file   Years of education: Not on file   Highest education level: Not on file  Occupational History   Not on file  Tobacco Use   Smoking status: Former    Types: Cigarettes    Quit date: 1980    Years since quitting: 43.4   Smokeless tobacco: Never   Tobacco comments:    Quit 40 years ago  Electronics engineer Use   Vaping Use: Never used  Substance and Sexual Activity   Alcohol use: Not Currently    Comment: occational beer   Drug use: Never   Sexual activity: Not Currently  Other Topics Concern   Not on file  Social History Narrative   Not on file   Social Determinants of Health   Financial Resource Strain: Not on file  Food Insecurity: Not on file  Transportation Needs: Not on file  Physical Activity: Not on file  Stress: Not on  file  Social Connections: Not on file  Intimate Partner Violence: Not on file    FAMILY HISTORY: Family History  Problem Relation Age of Onset   Diabetes Mother    Colon cancer Mother    Breast cancer Mother    Cancer Father        carcinoma   Bone cancer Brother    Diabetes Maternal Grandmother     ALLERGIES:  is allergic to celecoxib, nitrofurantoin macrocrystal, nitrofurantoin, and sulfa antibiotics.  MEDICATIONS:  Current Outpatient Medications  Medication Sig Dispense Refill   almotriptan (AXERT) 12.5 MG tablet      ARMOUR THYROID 60 MG tablet Take 60 mg by mouth daily.     cetirizine (ZYRTEC) 10 MG tablet Take by mouth.     clobetasol cream (TEMOVATE) 0.05 % Apply to affected areas hands 1-2 times a day until rash improved. Avoid face, groin, underarms. 30 g 1   ergocalciferol (VITAMIN D2) 1.25 MG (50000 UT) capsule Take by mouth.     ketoconazole (NIZORAL) 2 % cream Apply to affected areas body folds 1-2 times a day as needed until  rash improved. 60 g 2   lansoprazole (PREVACID) 15 MG capsule      Na Sulfate-K Sulfate-Mg Sulf 17.5-3.13-1.6 GM/177ML SOLN See admin instructions.     tacrolimus (PROTOPIC) 0.1 % ointment Apply to affected areas body folds 1-2 times a day as needed for itch. 60 g 2   topiramate (TOPAMAX) 200 MG tablet Topamax 200 mg tablet     topiramate (TOPAMAX) 50 MG tablet Take 50 mg by mouth 2 (two) times daily.     valACYclovir (VALTREX) 1000 MG tablet      apixaban (ELIQUIS) 5 MG TABS tablet Take 1 tablet (33m) twice daily 60 tablet 3   estradiol (ESTRACE) 0.1 MG/GM vaginal cream Estrogen Cream Instruction Discard applicator Apply pea sized amount to tip of finger to urethra before bed. Wash hands well after application. Use Monday, Wednesday and Friday 42.5 g 2   triamcinolone ointment (KENALOG) 0.1 % Apply topically.     No current facility-administered medications for this visit.     PHYSICAL EXAMINATION: ECOG PERFORMANCE STATUS: 1 - Symptomatic  but completely ambulatory Vitals:   03/06/22 1010  BP: 121/70  Pulse: 61  Temp: (!) 96.5 F (35.8 C)   Filed Weights   03/06/22 1010  Weight: 160 lb (72.6 kg)    Physical Exam Constitutional:      General: She is not in acute distress. HENT:     Head: Normocephalic and atraumatic.  Eyes:     General: No scleral icterus. Cardiovascular:     Rate and Rhythm: Normal rate.  Pulmonary:     Effort: Pulmonary effort is normal. No respiratory distress.  Abdominal:     General: There is no distension.  Musculoskeletal:        General: No deformity. Normal range of motion.     Cervical back: Normal range of motion and neck supple.  Skin:    General: Skin is warm and dry.     Findings: No erythema or rash.  Neurological:     Mental Status: She is alert and oriented to person, place, and time. Mental status is at baseline.     Cranial Nerves: No cranial nerve deficit.     Coordination: Coordination normal.     LABORATORY DATA:  I have reviewed the data as listed Lab Results  Component Value Date   WBC 7.9 03/06/2022   HGB 12.6 03/06/2022   HCT 38.9 03/06/2022   MCV 89.4 03/06/2022   PLT 633 (H) 03/06/2022   Recent Labs    01/22/22 1417 03/06/22 0957  NA  --  138  K  --  3.9  CL  --  108  CO2  --  24  GLUCOSE  --  95  BUN  --  13  CREATININE 1.00 0.78  CALCIUM  --  9.1  GFRNONAA  --  >60  PROT  --  7.7  ALBUMIN  --  4.2  AST  --  18  ALT  --  18  ALKPHOS  --  48  BILITOT  --  0.6    Iron/TIBC/Ferritin/ %Sat    Component Value Date/Time   IRON 62 01/02/2022 1557   TIBC 323 01/02/2022 1557   FERRITIN 115 01/02/2022 1557   IRONPCTSAT 19 01/02/2022 1557      RADIOGRAPHIC STUDIES: I have personally reviewed the radiological images as listed and agreed with the findings in the report. No results found.

## 2022-03-06 NOTE — Assessment & Plan Note (Signed)
JAK2 V617F mutation negative, with reflex to other mutations CALR, MPL, JAK 2 Ex 12-15 mutations negative. Likely reactive.  Labs reviewed and discussed with patient.  Platelet count trended down. Check CRP, ESR.

## 2022-03-06 NOTE — Assessment & Plan Note (Signed)
Patient declined genetic testing. 

## 2022-03-06 NOTE — Assessment & Plan Note (Signed)
CT head without contrast did not show any acute intracranial changes. Recommend patient to follow-up with primary care provider for management.

## 2022-03-06 NOTE — Assessment & Plan Note (Signed)
Unprovoked, negative factor V Leiden mutation.  Negative prothrombin gene mutation Eliquis 5 mg twice daily

## 2022-03-11 ENCOUNTER — Ambulatory Visit: Payer: Self-pay | Admitting: Urology

## 2022-03-11 ENCOUNTER — Encounter: Payer: Self-pay | Admitting: Oncology

## 2022-03-11 NOTE — Telephone Encounter (Signed)
Please advise 

## 2022-03-12 ENCOUNTER — Other Ambulatory Visit: Payer: Self-pay | Admitting: Family Medicine

## 2022-03-12 DIAGNOSIS — R1012 Left upper quadrant pain: Secondary | ICD-10-CM

## 2022-03-18 ENCOUNTER — Ambulatory Visit (INDEPENDENT_AMBULATORY_CARE_PROVIDER_SITE_OTHER): Payer: 59 | Admitting: Urology

## 2022-03-18 ENCOUNTER — Encounter: Payer: Self-pay | Admitting: Urology

## 2022-03-18 VITALS — BP 117/69 | HR 66 | Ht 63.0 in | Wt 160.0 lb

## 2022-03-18 DIAGNOSIS — N952 Postmenopausal atrophic vaginitis: Secondary | ICD-10-CM

## 2022-03-18 DIAGNOSIS — R300 Dysuria: Secondary | ICD-10-CM

## 2022-03-18 LAB — URINALYSIS, COMPLETE
Bilirubin, UA: NEGATIVE
Glucose, UA: NEGATIVE
Ketones, UA: NEGATIVE
Leukocytes,UA: NEGATIVE
Nitrite, UA: NEGATIVE
Protein,UA: NEGATIVE
Specific Gravity, UA: 1.02 (ref 1.005–1.030)
Urobilinogen, Ur: 0.2 mg/dL (ref 0.2–1.0)
pH, UA: 5.5 (ref 5.0–7.5)

## 2022-03-18 LAB — MICROSCOPIC EXAMINATION
Bacteria, UA: NONE SEEN
WBC, UA: NONE SEEN /hpf (ref 0–5)

## 2022-03-18 MED ORDER — FLUCONAZOLE 100 MG PO TABS: 100.0000 mg | ORAL_TABLET | Freq: Every day | ORAL | 0 refills | Status: DC

## 2022-03-21 ENCOUNTER — Ambulatory Visit
Admission: RE | Admit: 2022-03-21 | Discharge: 2022-03-21 | Disposition: A | Discharge status: Home / Self Care | Payer: 59 | Source: Ambulatory Visit | Attending: Family Medicine | Admitting: Family Medicine

## 2022-03-21 DIAGNOSIS — R1012 Left upper quadrant pain: Secondary | ICD-10-CM | POA: Diagnosis present

## 2022-03-21 MED ORDER — GADOBUTROL 1 MMOL/ML IV SOLN
7.0000 mL | Freq: Once | INTRAVENOUS | Status: AC | PRN
  Administered 2022-03-21: 7 mL via INTRAVENOUS

## 2022-04-01 ENCOUNTER — Ambulatory Visit (INDEPENDENT_AMBULATORY_CARE_PROVIDER_SITE_OTHER): Payer: 59 | Admitting: Physician Assistant

## 2022-04-01 VITALS — BP 135/73 | HR 72 | Ht 63.0 in | Wt 160.0 lb

## 2022-04-01 DIAGNOSIS — R300 Dysuria: Secondary | ICD-10-CM

## 2022-04-01 MED ORDER — CEFUROXIME AXETIL 250 MG PO TABS: 250.0000 mg | ORAL_TABLET | Freq: Two times a day (BID) | ORAL | 0 refills | Status: AC

## 2022-04-01 NOTE — Progress Notes (Unsigned)
04/01/2022 4:51 PM   Robin Choi October 29, 1960 [id number]  CC: Chief Complaint  Patient presents with   Dysuria   HPI: Robin Choi is a 61 y.o. female with PMH vaginal burning and dryness and mixed incontinence who presents today for evaluation of possible UTI.   She underwent cystoscopy with Dr. Matilde Sprang 2 weeks ago with no significant findings.  She reports almost immediate dysuria and urgency thereafter which improved over time, but did not resolve.  It acutely worsened 3 days ago.  She has been taking Azo for symptom relief.  She previously tried vaginal estrogen cream, but feels that she got a yeast infection from it.  She is scheduled with gynecology.  In-office UA today positive for trace protein, nitrites, and trace leukocyte esterase; urine microscopy with 6-10 WBCs/HPF and amorphous sediment.  PMH: Past Medical History:  Diagnosis Date   GERD (gastroesophageal reflux disease)    Hypothyroidism     Surgical History: Past Surgical History:  Procedure Laterality Date   COLONOSCOPY WITH PROPOFOL N/A 01/15/2022   Procedure: COLONOSCOPY WITH PROPOFOL;  Surgeon: Jonathon Bellows, MD;  Location: [facility] ENDOSCOPY;  Service: Gastroenterology;  Laterality: N/A;  Needs an early appointment.  Has disabled brother and easier to get sitter early in the morning.   NOVASURE ABLATION     OTHER SURGICAL HISTORY     "tubal cauterization"    Home Medications:  Allergies as of 04/01/2022       Reactions   Celecoxib Hives   Nitrofurantoin Macrocrystal Hives   Nitrofurantoin Hives   Sulfa Antibiotics Hives        Medication List        Accurate as of April 01, 2022  4:51 PM. If you have any questions, ask your nurse or doctor.          STOP taking these medications    fluconazole 100 MG tablet Commonly known as: DIFLUCAN       TAKE these medications    almotriptan 12.5 MG tablet Commonly known as: AXERT   apixaban 5 MG Tabs tablet Commonly known as:  ELIQUIS Take 1 tablet ('5mg'$ ) twice daily   Armour Thyroid 60 MG tablet Generic drug: thyroid Take 60 mg by mouth daily.   cefUROXime 250 MG tablet Commonly known as: CEFTIN Take 1 tablet (250 mg total) by mouth 2 (two) times daily with a meal for 5 days.   cetirizine 10 MG tablet Commonly known as: ZYRTEC Take by mouth.   clobetasol cream 0.05 % Commonly known as: TEMOVATE Apply to affected areas hands 1-2 times a day until rash improved. Avoid face, groin, underarms.   ergocalciferol 1.25 MG (50000 UT) capsule Commonly known as: VITAMIN D2 Take by mouth.   ketoconazole 2 % cream Commonly known as: NIZORAL Apply to affected areas body folds 1-2 times a day as needed until rash improved.   lansoprazole 15 MG capsule Commonly known as: PREVACID   Na Sulfate-K Sulfate-Mg Sulf 17.5-3.13-1.6 GM/177ML Soln See admin instructions.   tacrolimus 0.1 % ointment Commonly known as: PROTOPIC Apply to affected areas body folds 1-2 times a day as needed for itch.   topiramate 200 MG tablet Commonly known as: TOPAMAX Topamax 200 mg tablet   topiramate 50 MG tablet Commonly known as: TOPAMAX Take 50 mg by mouth 2 (two) times daily.   valACYclovir 1000 MG tablet Commonly known as: VALTREX        Allergies:  Allergies  Allergen Reactions   Celecoxib Hives  Nitrofurantoin Macrocrystal Hives   Nitrofurantoin Hives   Sulfa Antibiotics Hives    Family History: Family History  Problem Relation Age of Onset   Diabetes Mother    Colon cancer Mother    Breast cancer Mother    Cancer Father        carcinoma   Bone cancer Brother    Diabetes Maternal Grandmother     Social History:   reports that she quit smoking about 43 years ago. Her smoking use included cigarettes. She has never used smokeless tobacco. She reports that she does not currently use alcohol. She reports that she does not use drugs.  Physical Exam: BP 135/73   Pulse 72   Ht '5\' 3"'$  (1.6 m)   Wt 160 lb  (72.6 kg)   BMI 28.34 kg/m   Constitutional:  Alert and oriented, no acute distress, nontoxic appearing HEENT: [place], AT Cardiovascular: No clubbing, cyanosis, or edema Respiratory: Normal respiratory effort, no increased work of breathing Skin: No rashes, bruises or suspicious lesions Neurologic: Grossly intact, no focal deficits, moving all 4 extremities Psychiatric: Normal mood and affect  Laboratory Data: Results for orders placed or performed in visit on 04/01/22  Microscopic Examination   Urine  Result Value Ref Range   WBC, UA 6-10 (A) 0 - 5 /hpf   RBC, Urine None seen 0 - 2 /hpf   Epithelial Cells (non renal) 0-10 0 - 10 /hpf   Crystals Present (A) N/A   Crystal Type Amorphous Sediment N/A   Bacteria, UA Few (A) None seen/Few  Urinalysis, Complete  Result Value Ref Range   Specific Gravity, UA 1.015 1.005 - 1.030   pH, UA 7.5 5.0 - 7.5   Color, UA Yellow Yellow   Appearance Ur Cloudy (A) Clear   Leukocytes,UA Trace (A) Negative   Protein,UA Trace (A) Negative/Trace   Glucose, UA Negative Negative   Ketones, UA Negative Negative   RBC, UA Negative Negative   Bilirubin, UA Negative Negative   Urobilinogen, Ur 0.2 0.2 - 1.0 mg/dL   Nitrite, UA Positive (A) Negative   Microscopic Examination See below:    Assessment & Plan:   1. Dysuria UA today notable for pyuria greater than prior; nitrites likely false positive in the setting of Azo use.  Will send for standard and atypical cultures and treat with empiric cefuroxime in the interim.  I think that her vaginal burning may be contributory and counseled her to consider a nonhormonal vaginal moisturizer to see if she tolerates it better.  She expressed understanding. - Urinalysis, Complete - CULTURE, URINE COMPREHENSIVE - cefUROXime (CEFTIN) 250 MG tablet; Take 1 tablet (250 mg total) by mouth 2 (two) times daily with a meal for 5 days.  Dispense: 10 tablet; Refill: 0 - Mycoplasma / ureaplasma culture  Return if symptoms  worsen or fail to improve.  Debroah Loop, PA-C  [facility] [address] [place], [place] [address] [phone]

## 2022-04-01 NOTE — Patient Instructions (Signed)
Try the over-the-counter nonhormonal vaginal moisturizer called Replens to help with your dryness.

## 2022-04-02 LAB — URINALYSIS, COMPLETE
Bilirubin, UA: NEGATIVE
Glucose, UA: NEGATIVE
Ketones, UA: NEGATIVE
Nitrite, UA: POSITIVE — AB
RBC, UA: NEGATIVE
Specific Gravity, UA: 1.015 (ref 1.005–1.030)
Urobilinogen, Ur: 0.2 mg/dL (ref 0.2–1.0)
pH, UA: 7.5 (ref 5.0–7.5)

## 2022-04-02 LAB — MICROSCOPIC EXAMINATION: RBC, Urine: NONE SEEN /hpf (ref 0–2)

## 2022-04-04 LAB — CULTURE, URINE COMPREHENSIVE

## 2022-04-09 LAB — MYCOPLASMA / UREAPLASMA CULTURE
Mycoplasma hominis Culture: NEGATIVE
Ureaplasma urealyticum: NEGATIVE

## 2022-04-23 ENCOUNTER — Telehealth: Payer: Self-pay

## 2022-04-23 ENCOUNTER — Telehealth: Payer: Self-pay | Admitting: Hematology and Oncology

## 2022-04-23 DIAGNOSIS — D75839 Thrombocytosis, unspecified: Secondary | ICD-10-CM

## 2022-04-23 DIAGNOSIS — Z86711 Personal history of pulmonary embolism: Secondary | ICD-10-CM

## 2022-04-23 NOTE — Telephone Encounter (Signed)
Hematology referral placed to [facility].

## 2022-04-23 NOTE — Telephone Encounter (Signed)
-----   Message from Earlie Server, MD sent at 04/22/2022 11:36 PM EDT ----- Regarding: RE: transfer of care Please refer to [facility].  No specific attending.  Please cancel follow up appt and labs we placed here. Thanks.   ----- Message ----- From: Wallene Dales Sent: 04/22/2022   4:37 PM EDT To: Evelina Dun, RN; Earlie Server, MD; Pearson Grippe; # Subject: RE: transfer of care                           Please see attached message below. Pt will need a referral place to transfer her care to [facility].  Thank you Brooke  ----- Message ----- From: Pearson Grippe Sent: 04/22/2022   3:32 PM EDT To: Wallene Dales Subject: transfer of care                               Hi Brooke,   This patient has requested a transfer to [facility] to obtain a second opinion. Will you communicate with Dr. Tasia Catchings and see if he has any recommendations on who I should schedule her with. Thanks.

## 2022-04-23 NOTE — Telephone Encounter (Signed)
Patient called back and spoke to Clent Ridges, stating that she would like to keep her appts in Sept with Dr. Tasia Catchings. She just wants to go to [facility] for second opnion while waiting for Dr. Collie Siad appt. Appts have been added back.

## 2022-04-23 NOTE — Telephone Encounter (Signed)
Scheduled appt per 8/1 referral. Pt is aware of appt date and time. Pt is aware to arrive 15 mins prior to appt time and to bring and updated insurance card. Pt is aware of appt location.   

## 2022-05-16 ENCOUNTER — Inpatient Hospital Stay: Payer: 59 | Attending: Oncology | Admitting: Hematology and Oncology

## 2022-05-16 ENCOUNTER — Inpatient Hospital Stay: Payer: 59

## 2022-05-16 VITALS — BP 139/69 | HR 68 | Temp 98.3°F | Resp 20 | Wt 155.6 lb

## 2022-05-16 DIAGNOSIS — Z808 Family history of malignant neoplasm of other organs or systems: Secondary | ICD-10-CM | POA: Insufficient documentation

## 2022-05-16 DIAGNOSIS — I2699 Other pulmonary embolism without acute cor pulmonale: Secondary | ICD-10-CM | POA: Diagnosis not present

## 2022-05-16 DIAGNOSIS — D75839 Thrombocytosis, unspecified: Secondary | ICD-10-CM

## 2022-05-16 DIAGNOSIS — Z800 Family history of malignant neoplasm of digestive organs: Secondary | ICD-10-CM | POA: Diagnosis not present

## 2022-05-16 DIAGNOSIS — Z87891 Personal history of nicotine dependence: Secondary | ICD-10-CM | POA: Diagnosis not present

## 2022-05-16 DIAGNOSIS — Z803 Family history of malignant neoplasm of breast: Secondary | ICD-10-CM | POA: Insufficient documentation

## 2022-05-16 LAB — CMP (CANCER CENTER ONLY)
ALT: 14 U/L (ref 0–44)
AST: 15 U/L (ref 15–41)
Albumin: 4.5 g/dL (ref 3.5–5.0)
Alkaline Phosphatase: 50 U/L (ref 38–126)
Anion gap: 3 — ABNORMAL LOW (ref 5–15)
BUN: 14 mg/dL (ref 8–23)
CO2: 24 mmol/L (ref 22–32)
Calcium: 9.3 mg/dL (ref 8.9–10.3)
Chloride: 110 mmol/L (ref 98–111)
Creatinine: 0.77 mg/dL (ref 0.44–1.00)
GFR, Estimated: >60 mL/min (ref >60)
Glucose, Bld: 100 mg/dL — ABNORMAL HIGH (ref 70–99)
Potassium: 4 mmol/L (ref 3.5–5.1)
Sodium: 137 mmol/L (ref 135–145)
Total Bilirubin: 0.4 mg/dL (ref 0.3–1.2)
Total Protein: 7.3 g/dL (ref 6.5–8.1)

## 2022-05-16 LAB — CBC WITH DIFFERENTIAL (CANCER CENTER ONLY)
Abs Immature Granulocytes: 0.01 10*3/uL (ref 0.00–0.07)
Basophils Absolute: 0.1 10*3/uL (ref 0.0–0.1)
Basophils Relative: 1 %
Eosinophils Absolute: 0.3 10*3/uL (ref 0.0–0.5)
Eosinophils Relative: 4 %
HCT: 37.6 % (ref 36.0–46.0)
Hemoglobin: 12.4 g/dL (ref 12.0–15.0)
Immature Granulocytes: 0 %
Lymphocytes Relative: 29 %
Lymphs Abs: 1.9 10*3/uL (ref 0.7–4.0)
MCH: 29.1 pg (ref 26.0–34.0)
MCHC: 33 g/dL (ref 30.0–36.0)
MCV: 88.3 fL (ref 80.0–100.0)
Monocytes Absolute: 0.6 10*3/uL (ref 0.1–1.0)
Monocytes Relative: 8 %
Neutro Abs: 3.9 10*3/uL (ref 1.7–7.7)
Neutrophils Relative %: 58 %
Platelet Count: 594 10*3/uL — ABNORMAL HIGH (ref 150–400)
RBC: 4.26 MIL/uL (ref 3.87–5.11)
RDW: 14.4 % (ref 11.5–15.5)
WBC Count: 6.7 10*3/uL (ref 4.0–10.5)
nRBC: 0 % (ref 0.0–0.2)

## 2022-05-16 LAB — IRON AND IRON BINDING CAPACITY (CC-WL,HP ONLY)
Iron: 62 ug/dL (ref 28–170)
Saturation Ratios: 22 % (ref 10.4–31.8)
TIBC: 288 ug/dL (ref 250–450)
UIBC: 226 ug/dL (ref 148–442)

## 2022-05-16 NOTE — Progress Notes (Signed)
[place] [phone]) [phone]   Fax:([phone] [place] NOTE  Patient Care Team: [place] Pink, MD as PCP - General (Family Medicine) Sanda Klein, Satira Anis, MD (Family Medicine)  Hematological/Oncological History # Thrombocytosis # Unprovoked Pulmonary Embolism  12/11/2021, CBC showed a platelet count of.  804,000. 12/17/2021, platelet count 795,000 12/27/2021, platelet count 872,000. 01/22/2022 CT chest angiogram was obtained for work up of left side pain below rib cage. CT showed Small volume nonocclusive pulmonary embolism in a posterior basilar left lower lobe pulmonary artery branch 03/06/2022: last visit with Dr. Tasia Catchings  05/16/2022: presents to Dr. Lorenso Courier for a second opinion  CHIEF COMPLAINTS/PURPOSE OF CONSULTATION:  "Unprovoked PE and Thrombocytosis "  HISTORY OF PRESENTING ILLNESS:  Robin Choi 61 y.o. female with medical history significant for hypothyroidism and GERD who presents for evaluation of thrombocytosis and unprovoked pulmonary embolism.  On review of the previous records patient is currently followed by Dr. Tasia Catchings in the [facility] health system.  The patient had an unprovoked pulmonary embolism and also a longstanding history of thrombocytosis.  Patient underwent testing including JAK2, CAL R, MPL and the findings were negative.  Additionally CRP and ESR were within normal limits.  Platelet count has steadily trended downward since initial diagnosis.  CBC on 12/11/2021 showed platelet count of 840,000.  On 01/22/2022 patient underwent CT angiogram for left-sided pain below the rib cage and was found to have a small volume nonocclusive pulmonary embolism in the posterior basilar left lobe pulmonary artery branch.  Patient was started up on Eliquis at that time.  Due to concern that the diagnosis has not yet been reached the patient requested a second opinion at [facility].  On exam today Robin Choi notes that she is concerned about her elevated platelet  count and blood clot.  She reports that she "does not know what brought it on".  She reports that she had not seen a PCP for quite some time since at least 2019.  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  She reports that she does have occasional bouts of abdominal pain due to urinary tract infections.  When recently she developed the pain up under her rib cage she underwent a CTA study which showed the pulmonary embolism.  She does that she is tolerating her Eliquis therapy well without any bleeding, bruising, or dark stools.  She reports medications not typically costly.  She notes unfortunately the medication has not helped with the pain.  She notes that she feels a lump under the rib on the left side though CT imaging does not show what the etiology is.  She otherwise denies any fevers, chills, sweats, nausea, vomiting or diarrhea.  Full 10 point ROS is listed below.  MEDICAL HISTORY:  Past Medical History:  Diagnosis Date   GERD (gastroesophageal reflux disease)    Hypothyroidism     SURGICAL HISTORY: Past Surgical History:  Procedure Laterality Date   COLONOSCOPY WITH PROPOFOL N/A 01/15/2022   Procedure: COLONOSCOPY WITH PROPOFOL;  Surgeon: Jonathon Bellows, MD;  Location: [facility] ENDOSCOPY;  Service: Gastroenterology;  Laterality: N/A;  Needs an early appointment.  Has disabled brother and easier to get sitter early in the morning.   NOVASURE ABLATION     OTHER SURGICAL HISTORY     "tubal cauterization"    SOCIAL HISTORY: Social History   Socioeconomic History   Marital status: Married    Spouse name:  Not on file   Number of children: Not on file   Years of education: Not on file   Highest education level: Not on file  Occupational History   Not on file  Tobacco Use   Smoking status: Former    Types: Cigarettes    Quit date: 90    Years since quitting: 43.6   Smokeless tobacco: Never   Tobacco comments:     Quit 40 years ago  Vaping Use   Vaping Use: Never used  Substance and Sexual Activity   Alcohol use: Not Currently    Comment: occational beer   Drug use: Never   Sexual activity: Not Currently  Other Topics Concern   Not on file  Social History Narrative   Not on file   Social Determinants of Health   Financial Resource Strain: Not on file  Food Insecurity: Not on file  Transportation Needs: Not on file  Physical Activity: Not on file  Stress: Not on file  Social Connections: Not on file  Intimate Partner Violence: Not on file    FAMILY HISTORY: Family History  Problem Relation Age of Onset   Diabetes Mother    Colon cancer Mother    Breast cancer Mother    Cancer Father        carcinoma   Bone cancer Brother    Diabetes Maternal Grandmother     ALLERGIES:  is allergic to celecoxib, nitrofurantoin macrocrystal, nitrofurantoin, and sulfa antibiotics.  MEDICATIONS:  Current Outpatient Medications  Medication Sig Dispense Refill   almotriptan (AXERT) 12.5 MG tablet      apixaban (ELIQUIS) 5 MG TABS tablet Take 1 tablet (57m) twice daily 60 tablet 3   ARMOUR THYROID 60 MG tablet Take 60 mg by mouth daily.     cetirizine (ZYRTEC) 10 MG tablet Take by mouth.     clobetasol cream (TEMOVATE) 0.05 % Apply to affected areas hands 1-2 times a day until rash improved. Avoid face, groin, underarms. 30 g 1   ergocalciferol (VITAMIN D2) 1.25 MG (50000 UT) capsule Take by mouth.     ketoconazole (NIZORAL) 2 % cream Apply to affected areas body folds 1-2 times a day as needed until rash improved. 60 g 2   lansoprazole (PREVACID) 15 MG capsule      topiramate (TOPAMAX) 50 MG tablet Take 50 mg by mouth 2 (two) times daily.     valACYclovir (VALTREX) 1000 MG tablet      YUVAFEM 10 MCG TABS vaginal tablet Place vaginally.     No current facility-administered medications for this visit.    REVIEW OF SYSTEMS:   Constitutional: ( - ) fevers, ( - )  chills , ( - ) night sweats Eyes:  ( - ) blurriness of vision, ( - ) double vision, ( - ) watery eyes Ears, nose, mouth, throat, and face: ( - ) mucositis, ( - ) sore throat Respiratory: ( - ) cough, ( - ) dyspnea, ( - ) wheezes Cardiovascular: ( - ) palpitation, ( - ) chest discomfort, ( - ) lower extremity swelling Gastrointestinal:  ( - ) nausea, ( - ) heartburn, ( - ) change in bowel habits Skin: ( - ) abnormal skin rashes Lymphatics: ( - ) new lymphadenopathy, ( - ) easy bruising Neurological: ( - ) numbness, ( - ) tingling, ( - ) new weaknesses Behavioral/Psych: ( - ) mood change, ( - ) new changes  All other systems were reviewed with the patient and are negative.  PHYSICAL EXAMINATION:  Vitals:   05/16/22 1311  BP: 139/69  Pulse: 68  Resp: 20  Temp: 98.3 F (36.8 C)  SpO2: 99%   Filed Weights   05/16/22 1311  Weight: 155 lb 9.6 oz (70.6 kg)    GENERAL: well appearing middle-aged female in NAD  SKIN: skin color, texture, turgor are normal, no rashes or significant lesions EYES: conjunctiva are pink and non-injected, sclera clear LUNGS: clear to auscultation and percussion with normal breathing effort HEART: regular rate & rhythm and no murmurs and no lower extremity edema Musculoskeletal: no cyanosis of digits and no clubbing  PSYCH: alert & oriented x 3, fluent speech NEURO: no focal motor/sensory deficits  LABORATORY DATA:  I have reviewed the data as listed    Latest Ref Rng & Units 05/16/2022    2:46 PM 03/06/2022    9:57 AM 01/02/2022    3:57 PM  CBC  WBC 4.0 - 10.5 K/uL 6.7  7.9  6.4   Hemoglobin 12.0 - 15.0 g/dL 12.4  12.6  13.2   Hematocrit 36.0 - 46.0 % 37.6  38.9  40.8   Platelets 150 - 400 K/uL 594  633  827        Latest Ref Rng & Units 05/16/2022    2:46 PM 03/06/2022    9:57 AM 01/22/2022    2:17 PM  CMP  Glucose 70 - 99 mg/dL 100  95    BUN 8 - 23 mg/dL 14  13    Creatinine 0.44 - 1.00 mg/dL 0.77  0.78  1.00   Sodium 135 - 145 mmol/L 137  138    Potassium 3.5 - 5.1 mmol/L 4.0   3.9    Chloride 98 - 111 mmol/L 110  108    CO2 22 - 32 mmol/L 24  24    Calcium 8.9 - 10.3 mg/dL 9.3  9.1    Total Protein 6.5 - 8.1 g/dL 7.3  7.7    Total Bilirubin 0.3 - 1.2 mg/dL 0.4  0.6    Alkaline Phos 38 - 126 U/L 50  48    AST 15 - 41 U/L 15  18    ALT 0 - 44 U/L 14  18       ASSESSMENT & PLAN Robin Choi 61 y.o. female with medical history significant for hypothyroidism and GERD who presents for evaluation of thrombocytosis and unprovoked pulmonary embolism.  After review of the labs, review of the records, and discussion with the patient the patients findings are most consistent with thrombocytosis and an unprovoked pulmonary embolism.  #Unprovoked Pulmonary Embolism -- Prior testing negative for prothrombin gene mutation and factor V Leiden.  We will additionally check antiphospholipid antibodies. -- Agree with continued Eliquis 5 mg twice daily. -- Due to the unprovoked nature of this VTE would recommend indefinite anticoagulation. -- The pain was initially prompted the CT scan has not subsided with anticoagulation therapy.  No clear findings on scan to explain this finding.  Also physical exam was unrevealing. -- Recommend patient continue to follow with Dr. Tasia Catchings locally.  #Thrombocytosis -- Patient initially underwent testing for JAK2, CALR, MPL.  We will order an extended NGS panel to search for other possible non-canonical mutations.  -- May want to consider bone marrow biopsy.  There is an entity known as triple negative essential thrombocytosis.  In these cases the driver mutation is not found but the bone marrow pathology is consistent with essential thrombocytosis.  The tend to be  low risk for VTE however.  This would be the next step in diagnosis. -- Iron studies and inflammatory markers otherwise unremarkable  Orders Placed This Encounter  Procedures   CBC with Differential ([facility] Only)    Standing Status:   Future    Number of Occurrences:   1     Standing Expiration Date:   05/17/2023   CMP ([place] only)    Standing Status:   Future    Number of Occurrences:   1    Standing Expiration Date:   05/17/2023   Ferritin    Standing Status:   Future    Number of Occurrences:   1    Standing Expiration Date:   05/17/2023   Iron and Iron Binding Capacity ([facility] only)    Standing Status:   Future    Number of Occurrences:   1    Standing Expiration Date:   05/17/2023   JAK2 (INCLUDING V617F AND EXON 12), MPL,& CALR W/RFL MPN PANEL (NGS)    Standing Status:   Future    Number of Occurrences:   1    Standing Expiration Date:   05/16/2023   BCR ABL1 FISH ([facility])    Standing Status:   Future    Number of Occurrences:   1    Standing Expiration Date:   05/17/2023   Beta-2-glycoprotein i abs, IgG/M/A    Standing Status:   Future    Number of Occurrences:   1    Standing Expiration Date:   05/16/2023   Cardiolipin antibodies, IgG, IgM, IgA*    Standing Status:   Future    Number of Occurrences:   1    Standing Expiration Date:   05/16/2023    All questions were answered. The patient knows to call the clinic with any problems, questions or concerns.  A total of more than 40 minutes were spent on this encounter with face-to-face time and non-face-to-face time, including preparing to see the patient, ordering tests and/or medications, counseling the patient and coordination of care as outlined above.   Ledell Peoples, MD Department of Hematology/Oncology [place] at [facility] Phone: [phone] Pager: [phone] Email: [email or web address]  05/25/2022 6:10 PM

## 2022-05-17 LAB — FERRITIN: Ferritin: 76 ng/mL (ref 11–307)

## 2022-05-18 LAB — CARDIOLIPIN ANTIBODIES, IGG, IGM, IGA
Anticardiolipin IgA: <9 APL U/mL (ref 0–11)
Anticardiolipin IgG: <9 GPL U/mL (ref 0–14)
Anticardiolipin IgM: <9 MPL U/mL (ref 0–12)

## 2022-05-18 LAB — BETA-2-GLYCOPROTEIN I ABS, IGG/M/A
Beta-2 Glyco I IgG: <9 GPI IgG units (ref 0–20)
Beta-2-Glycoprotein I IgA: <9 GPI IgA units (ref 0–25)
Beta-2-Glycoprotein I IgM: <9 GPI IgM units (ref 0–32)

## 2022-05-22 LAB — JAK2 (INCLUDING V617F AND EXON 12), MPL,& CALR W/RFL MPN PANEL (NGS)

## 2022-05-22 LAB — BCR ABL1 FISH (GENPATH)

## 2022-05-31 ENCOUNTER — Telehealth: Payer: Self-pay | Admitting: *Deleted

## 2022-05-31 NOTE — Telephone Encounter (Signed)
Received call from pt inquiring about her lab results from 05/16/22. Advised all lab results are in and that Dr. Lorenso Courier needs to review and then make any recommendations. Advised that other than her platelet count being elevated (though not as high as it has been in the past), her labs looked to be within normal limits.  Advised that I would let Dr. Lorenso Courier know she called.  Advised we will call back with any recommendations.  Pt voiced understanding.   Dr. Henrine Screws advise.

## 2022-06-04 ENCOUNTER — Encounter: Payer: Self-pay | Admitting: Hematology and Oncology

## 2022-06-06 ENCOUNTER — Other Ambulatory Visit: Payer: 59

## 2022-06-06 ENCOUNTER — Inpatient Hospital Stay: Payer: 59 | Attending: Oncology

## 2022-06-06 ENCOUNTER — Encounter: Payer: Self-pay | Admitting: Oncology

## 2022-06-06 ENCOUNTER — Inpatient Hospital Stay (HOSPITAL_BASED_OUTPATIENT_CLINIC_OR_DEPARTMENT_OTHER): Payer: 59 | Admitting: Oncology

## 2022-06-06 ENCOUNTER — Telehealth: Payer: Self-pay

## 2022-06-06 ENCOUNTER — Ambulatory Visit: Payer: 59 | Admitting: Oncology

## 2022-06-06 VITALS — BP 128/64 | HR 56 | Temp 98.4°F | Resp 18 | Wt 156.3 lb

## 2022-06-06 DIAGNOSIS — Z808 Family history of malignant neoplasm of other organs or systems: Secondary | ICD-10-CM | POA: Diagnosis not present

## 2022-06-06 DIAGNOSIS — R1010 Upper abdominal pain, unspecified: Secondary | ICD-10-CM | POA: Diagnosis not present

## 2022-06-06 DIAGNOSIS — D75839 Thrombocytosis, unspecified: Secondary | ICD-10-CM

## 2022-06-06 DIAGNOSIS — I2699 Other pulmonary embolism without acute cor pulmonale: Secondary | ICD-10-CM

## 2022-06-06 DIAGNOSIS — Z86711 Personal history of pulmonary embolism: Secondary | ICD-10-CM | POA: Insufficient documentation

## 2022-06-06 DIAGNOSIS — R252 Cramp and spasm: Secondary | ICD-10-CM | POA: Diagnosis not present

## 2022-06-06 DIAGNOSIS — Z87891 Personal history of nicotine dependence: Secondary | ICD-10-CM | POA: Insufficient documentation

## 2022-06-06 DIAGNOSIS — R300 Dysuria: Secondary | ICD-10-CM | POA: Diagnosis not present

## 2022-06-06 DIAGNOSIS — Z803 Family history of malignant neoplasm of breast: Secondary | ICD-10-CM | POA: Diagnosis not present

## 2022-06-06 DIAGNOSIS — Z7901 Long term (current) use of anticoagulants: Secondary | ICD-10-CM | POA: Diagnosis not present

## 2022-06-06 DIAGNOSIS — R079 Chest pain, unspecified: Secondary | ICD-10-CM | POA: Diagnosis not present

## 2022-06-06 DIAGNOSIS — Z800 Family history of malignant neoplasm of digestive organs: Secondary | ICD-10-CM | POA: Diagnosis not present

## 2022-06-06 DIAGNOSIS — Z87440 Personal history of urinary (tract) infections: Secondary | ICD-10-CM | POA: Diagnosis not present

## 2022-06-06 LAB — CBC WITH DIFFERENTIAL/PLATELET
Abs Immature Granulocytes: 0.03 10*3/uL (ref 0.00–0.07)
Basophils Absolute: 0.1 10*3/uL (ref 0.0–0.1)
Basophils Relative: 1 %
Eosinophils Absolute: 0.3 10*3/uL (ref 0.0–0.5)
Eosinophils Relative: 5 %
HCT: 38.6 % (ref 36.0–46.0)
Hemoglobin: 12.4 g/dL (ref 12.0–15.0)
Immature Granulocytes: 1 %
Lymphocytes Relative: 30 %
Lymphs Abs: 1.9 10*3/uL (ref 0.7–4.0)
MCH: 29.1 pg (ref 26.0–34.0)
MCHC: 32.1 g/dL (ref 30.0–36.0)
MCV: 90.6 fL (ref 80.0–100.0)
Monocytes Absolute: 0.6 10*3/uL (ref 0.1–1.0)
Monocytes Relative: 9 %
Neutro Abs: 3.5 10*3/uL (ref 1.7–7.7)
Neutrophils Relative %: 54 %
Platelets: 592 10*3/uL — ABNORMAL HIGH (ref 150–400)
RBC: 4.26 MIL/uL (ref 3.87–5.11)
RDW: 14.1 % (ref 11.5–15.5)
WBC: 6.4 10*3/uL (ref 4.0–10.5)
nRBC: 0 % (ref 0.0–0.2)

## 2022-06-06 LAB — COMPREHENSIVE METABOLIC PANEL
ALT: 17 U/L (ref 0–44)
AST: 17 U/L (ref 15–41)
Albumin: 4.1 g/dL (ref 3.5–5.0)
Alkaline Phosphatase: 48 U/L (ref 38–126)
Anion gap: 4 — ABNORMAL LOW (ref 5–15)
BUN: 17 mg/dL (ref 8–23)
CO2: 22 mmol/L (ref 22–32)
Calcium: 8.8 mg/dL — ABNORMAL LOW (ref 8.9–10.3)
Chloride: 110 mmol/L (ref 98–111)
Creatinine, Ser: 0.86 mg/dL (ref 0.44–1.00)
GFR, Estimated: >60 mL/min (ref >60)
Glucose, Bld: 104 mg/dL — ABNORMAL HIGH (ref 70–99)
Potassium: 4 mmol/L (ref 3.5–5.1)
Sodium: 136 mmol/L (ref 135–145)
Total Bilirubin: 0.6 mg/dL (ref 0.3–1.2)
Total Protein: 7.3 g/dL (ref 6.5–8.1)

## 2022-06-06 NOTE — Telephone Encounter (Signed)
PER LOS ON 9/14  - bone marrow biopsy - thrombocytosis  MD 2 weeks after bx to review results

## 2022-06-06 NOTE — Assessment & Plan Note (Addendum)
Unprovoked, negative factor V Leiden mutation.  Negative prothrombin gene mutation Negative cardiolipin antibody, negative beta glycoprotein antibodies Continue Eliquis 5 mg twice daily Repeat CT chest angiogram, if negative, will decreased Eliquis to 2.'5mg'$  BID

## 2022-06-06 NOTE — Assessment & Plan Note (Addendum)
JAK2 V617F mutation negative, with reflex to other mutations CALR, MPL, JAK 2 Ex 12-15 mutations negative. Extended NGS panel negative.  Normal inflammatory markers.  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

## 2022-06-06 NOTE — Progress Notes (Signed)
Pt here for follow up. Pt reports having leg cramps

## 2022-06-06 NOTE — Progress Notes (Signed)
Hematology/Oncology Progress note [phone] [phone]            Patient Care Team: Hedrick, James, MD as PCP - General (Family Medicine) Lada, Melinda P, MD (Family Medicine)   ASSESSMENT & PLAN:  Thrombocytosis JAK2 V617F mutation negative, with reflex to other mutations CALR, MPL, JAK 2 Ex 12-15 mutations negative. Extended NGS panel negative.  Normal inflammatory markers.  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   History of pulmonary embolism Unprovoked, negative factor V Leiden mutation.  Negative prothrombin gene mutation Negative cardiolipin antibody, negative beta glycoprotein antibodies Continue Eliquis 5 mg twice daily Repeat CT chest angiogram, if negative, will decreased Eliquis to 2.5mg BID  Orders Placed This Encounter  Procedures   CT Angio Chest Pulmonary Embolism (PE) W or WO Contrast    Standing Status:   Future    Standing Expiration Date:   06/07/2023    Order Specific Question:   If indicated for the ordered procedure, I authorize the administration of contrast media per Radiology protocol    Answer:   Yes    Order Specific Question:   Preferred imaging location?    Answer:   [facility]    Order Specific Question:   Radiology Contrast Protocol - do NOT remove file path    Answer:   [email or web address]   CT BONE MARROW BIOPSY & ASPIRATION    Standing Status:   Future    Standing Expiration Date:   06/07/2023    Order Specific Question:   Reason for Exam (SYMPTOM  OR DIAGNOSIS REQUIRED)    Answer:   thrombocytosis    Order Specific Question:   Preferred location?    Answer:   [facility]    Order Specific Question:   Radiology Contrast Protocol - do NOT remove file path    Answer:   [email or web address]   Follow-up  2 weeks after bone marrow  biopsy All questions were answered. The patient knows to call the clinic with any problems, questions or concerns.  Zhou Yu, MD, PhD [facility] 06/06/2022      CHIEF COMPLAINTS/REASON FOR VISIT:  thrombocytosis  HISTORY OF PRESENTING ILLNESS:   Robin Choi is a  60 y.o.  female with PMH listed below was seen in consultation at the request of  Hedrick, James, MD  for evaluation of thrombocytosis  For the past few months, patient reports lower abdominal pressure, dysuria symptoms.  She feels like "sitting on a ball"  patient has had evaluation by PCP and gynecology.  Patient reports frequent history of UTIs in the past. Patient had negative Pap smear, wet prep and ultrasound done by Dr. Hendrick. Dr. Beasley reviewed the ultrasound results and images with patient.  No abnormal findings.  Her bimanual examination revealed cervical tenderness, uterine tenderness and minimal obturator tenderness and +bilateral pudendal nerves were tender.  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  12/11/2021, CBC showed a platelet count of.  804,000. 12/17/2021, platelet count 795,000 12/27/2021, platelet count 872,000. Patient reports 5 pound weight loss since March 2023.  She has good appetite.  Denies night sweats or fever.  Patient reports having life stressors.  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  She has to lift him multiple times in order to provide care.  She lives   with her husband and brother-in-law.  Husband is on disability and not able to lift patient  Family history positive for father with carcinoma discovered in his neck, details unknown, breast cancer, colon cancer. She is overdue for colonoscopy.  She feels she could not get anyone to take care of brother-in-law during weekdays.  01/22/2022 CT  chest angiogram was obtained for work up of left side pain below rib cage. CT showed Small volume nonocclusive pulmonary embolism in a posterior basilar left lower lobe pulmonary artery branch INTERVAL HISTORY Robin Choi is a 60 y.o. female who has above history reviewed by me today presents for follow up visit for acute pulmonary embolism, thrombocytosis   During the interval, she has obtained second opinion with Dr. Dorsey John.  She had [facility] advanced NGS JAK2, MPL, CALR reflext to myeloid panel, negative,   She follows up with Gyn Dr.Beasely for vaginal dryness, vulvodynia, vulvar irritation.  She take Eliquis 5mg BID.  Continues to have left lower chest/upper abd pain.  Previous CT did not reveal any etiology to explain pain.  + leg cramps.    Review of Systems  Constitutional:  Negative for appetite change, chills, fatigue and fever.  HENT:   Negative for hearing loss and voice change.   Eyes:  Negative for eye problems.  Respiratory:  Negative for chest tightness and cough.   Cardiovascular:  Negative for chest pain.  Gastrointestinal:  Negative for abdominal distention and blood in stool.       Abdomen/pelvic pain,   Endocrine: Negative for hot flashes.  Genitourinary:  Negative for difficulty urinating and frequency.   Musculoskeletal:  Negative for arthralgias.       Leg cramps Left lowe chest wall pain  Skin:  Negative for itching and rash.  Neurological:  Positive for headaches. Negative for dizziness and extremity weakness.  Hematological:  Negative for adenopathy.  Psychiatric/Behavioral:  Negative for confusion.     MEDICAL HISTORY:  Past Medical History:  Diagnosis Date   GERD (gastroesophageal reflux disease)    Hypothyroidism     SURGICAL HISTORY: Past Surgical History:  Procedure Laterality Date   COLONOSCOPY WITH PROPOFOL N/A 01/15/2022   Procedure: COLONOSCOPY WITH PROPOFOL;  Surgeon: Anna, Kiran, MD;  Location: [facility] ENDOSCOPY;  Service:  Gastroenterology;  Laterality: N/A;  Needs an early appointment.  Has disabled brother and easier to get sitter early in the morning.   NOVASURE ABLATION     OTHER SURGICAL HISTORY     "tubal cauterization"    SOCIAL HISTORY: Social History   Socioeconomic History   Marital status: Married    Spouse name: Not on file   Number of children: Not on file   Years of education: Not on file   Highest education level: Not on file  Occupational History   Not on file  Tobacco Use   Smoking status: Former    Types: Cigarettes    Quit date: 1980    Years since quitting: 43.7   Smokeless tobacco: Never   Tobacco comments:    Quit 40 years ago  Vaping Use   Vaping Use: Never used  Substance and Sexual Activity   Alcohol use: Not Currently    Comment: occational beer   Drug use: Never   Sexual activity: Not Currently  Other Topics Concern   Not on file  Social History Narrative   Not on file   Social Determinants of Health   Financial Resource Strain: Not on file  Food Insecurity: Not   on file  Transportation Needs: Not on file  Physical Activity: Not on file  Stress: Not on file  Social Connections: Not on file  Intimate Partner Violence: Not on file    FAMILY HISTORY: Family History  Problem Relation Age of Onset   Diabetes Mother    Colon cancer Mother    Breast cancer Mother    Cancer Father        carcinoma   Bone cancer Brother    Diabetes Maternal Grandmother     ALLERGIES:  is allergic to celecoxib, nitrofurantoin macrocrystal, nitrofurantoin, and sulfa antibiotics.  MEDICATIONS:  Current Outpatient Medications  Medication Sig Dispense Refill   almotriptan (AXERT) 12.5 MG tablet      apixaban (ELIQUIS) 5 MG TABS tablet Take 1 tablet (5mg) twice daily 60 tablet 3   ARMOUR THYROID 60 MG tablet Take 60 mg by mouth daily.     cetirizine (ZYRTEC) 10 MG tablet Take by mouth.     clobetasol cream (TEMOVATE) 0.05 % Apply to affected areas hands 1-2 times a day  until rash improved. Avoid face, groin, underarms. 30 g 1   ergocalciferol (VITAMIN D2) 1.25 MG (50000 UT) capsule Take by mouth.     ketoconazole (NIZORAL) 2 % cream Apply to affected areas body folds 1-2 times a day as needed until rash improved. 60 g 2   lansoprazole (PREVACID) 15 MG capsule      topiramate (TOPAMAX) 50 MG tablet Take 50 mg by mouth 2 (two) times daily.     valACYclovir (VALTREX) 1000 MG tablet      YUVAFEM 10 MCG TABS vaginal tablet Place vaginally.     No current facility-administered medications for this visit.     PHYSICAL EXAMINATION: ECOG PERFORMANCE STATUS: 1 - Symptomatic but completely ambulatory Vitals:   06/06/22 1402  BP: 128/64  Pulse: (!) 56  Resp: 18  Temp: 98.4 F (36.9 C)   Filed Weights   06/06/22 1402  Weight: 156 lb 4.8 oz (70.9 kg)    Physical Exam Constitutional:      General: She is not in acute distress. HENT:     Head: Normocephalic and atraumatic.  Eyes:     General: No scleral icterus. Cardiovascular:     Rate and Rhythm: Normal rate.  Pulmonary:     Effort: Pulmonary effort is normal. No respiratory distress.  Abdominal:     General: There is no distension.  Musculoskeletal:        General: No deformity. Normal range of motion.     Cervical back: Normal range of motion and neck supple.  Skin:    General: Skin is warm and dry.     Findings: No erythema or rash.  Neurological:     Mental Status: She is alert and oriented to person, place, and time. Mental status is at baseline.  Psychiatric:        Mood and Affect: Mood normal.     LABORATORY DATA:  I have reviewed the data as listed    Latest Ref Rng & Units 06/06/2022    1:47 PM 05/16/2022    2:46 PM 03/06/2022    9:57 AM  CBC  WBC 4.0 - 10.5 K/uL 6.4  6.7  7.9   Hemoglobin 12.0 - 15.0 g/dL 12.4  12.4  12.6   Hematocrit 36.0 - 46.0 % 38.6  37.6  38.9   Platelets 150 - 400 K/uL 592  594  633       Latest Ref Rng &   Units 06/06/2022    1:47 PM 05/16/2022     2:46 PM 03/06/2022    9:57 AM  CMP  Glucose 70 - 99 mg/dL 104  100  95   BUN 8 - 23 mg/dL 17  14  13   Creatinine 0.44 - 1.00 mg/dL 0.86  0.77  0.78   Sodium 135 - 145 mmol/L 136  137  138   Potassium 3.5 - 5.1 mmol/L 4.0  4.0  3.9   Chloride 98 - 111 mmol/L 110  110  108   CO2 22 - 32 mmol/L 22  24  24   Calcium 8.9 - 10.3 mg/dL 8.8  9.3  9.1   Total Protein 6.5 - 8.1 g/dL 7.3  7.3  7.7   Total Bilirubin 0.3 - 1.2 mg/dL 0.6  0.4  0.6   Alkaline Phos 38 - 126 U/L 48  50  48   AST 15 - 41 U/L 17  15  18   ALT 0 - 44 U/L 17  14  18     Iron/TIBC/Ferritin/ %Sat    Component Value Date/Time   IRON 62 05/16/2022 1446   TIBC 288 05/16/2022 1446   FERRITIN 76 05/16/2022 1448   IRONPCTSAT 22 05/16/2022 1446      RADIOGRAPHIC STUDIES: I have personally reviewed the radiological images as listed and agreed with the findings in the report. No results found.  

## 2022-06-10 ENCOUNTER — Ambulatory Visit
Admission: RE | Admit: 2022-06-10 | Discharge: 2022-06-10 | Disposition: A | Discharge status: Home / Self Care | Payer: 59 | Source: Ambulatory Visit | Attending: Oncology | Admitting: Oncology

## 2022-06-10 DIAGNOSIS — I2699 Other pulmonary embolism without acute cor pulmonale: Secondary | ICD-10-CM | POA: Insufficient documentation

## 2022-06-10 MED ORDER — IOHEXOL 350 MG/ML SOLN
75.0000 mL | Freq: Once | INTRAVENOUS | Status: AC | PRN
  Administered 2022-06-10: 75 mL via INTRAVENOUS

## 2022-06-10 NOTE — Telephone Encounter (Signed)
Spoke to pt's husband, Joneen Caraway, and gave him Bmbx appt infor (9/22 @ 8:30. He verbalized understanding and says pt is aware of appt as well.   Please schedule pt for MD only approx 2 weeks after BmBX on 9/22. Please inform pt of MD appt.

## 2022-06-13 ENCOUNTER — Other Ambulatory Visit: Payer: Self-pay | Admitting: Student

## 2022-06-13 DIAGNOSIS — D75839 Thrombocytosis, unspecified: Secondary | ICD-10-CM

## 2022-06-13 NOTE — H&P (Signed)
Chief Complaint: Chronic thrombocytopenia and unprovoked PE. Team is requesting a bone marrow biopsy  Referring Physician(s): Yu,Zhou  Supervising Physician: Corrie Mckusick  Patient Status: [facility] - Out-pt  History of Present Illness: Robin Choi is a 61 y.o. female 61 y.o. female outpatient History of hypothyroidism, GERD. Found to have chronic thrombocytopenia and unprovoked PE. Team is requesting a bone marrow biopsy for further evaluation of  Reactive vs Triple negative essential thrombocythemia-myeloproliferative disease.   Currently without any significant complaints. Patient alert and laying in bed, calm. Denies any fevers, headache, chest pain, SOB, cough, abdominal pain, nausea, vomiting or bleeding. Return precautions and treatment recommendations and follow-up discussed with the patient  who is agreeable with the plan.     Past Medical History:  Diagnosis Date   GERD (gastroesophageal reflux disease)    Hypothyroidism     Past Surgical History:  Procedure Laterality Date   COLONOSCOPY WITH PROPOFOL N/A 01/15/2022   Procedure: COLONOSCOPY WITH PROPOFOL;  Surgeon: Jonathon Bellows, MD;  Location: [facility] ENDOSCOPY;  Service: Gastroenterology;  Laterality: N/A;  Needs an early appointment.  Has disabled brother and easier to get sitter early in the morning.   NOVASURE ABLATION     OTHER SURGICAL HISTORY     "tubal cauterization"    Allergies: Celecoxib, Nitrofurantoin macrocrystal, Nitrofurantoin, and Sulfa antibiotics  Medications: Prior to Admission medications   Medication Sig Start Date End Date Taking? Authorizing Provider  almotriptan (AXERT) 12.5 MG tablet  07/31/17   [provider]  apixaban (ELIQUIS) 5 MG TABS tablet Take 1 tablet (72m) twice daily 03/06/22   YEarlie Server MD  ARMOUR THYROID 60 MG tablet Take 60 mg by mouth daily. 12/05/19   [provider]  cetirizine (ZYRTEC) 10 MG tablet Take by mouth.    [provider]   clobetasol cream (TEMOVATE) 0.05 % Apply to affected areas hands 1-2 times a day until rash improved. Avoid face, groin, underarms. 12/13/20   SBrendolyn Patty MD  ergocalciferol (VITAMIN D2) 1.25 MG (50000 UT) capsule Take by mouth. 11/21/21   [provider]  ketoconazole (NIZORAL) 2 % cream Apply to affected areas body folds 1-2 times a day as needed until rash improved. 12/13/20   SBrendolyn Patty MD  lansoprazole (PREVACID) 15 MG capsule     [provider]  topiramate (TOPAMAX) 50 MG tablet Take 50 mg by mouth 2 (two) times daily. 01/15/22   [provider]  valACYclovir (VALTREX) 1000 MG tablet  09/11/15   [provider]  YUVAFEM 10 MCG TABS vaginal tablet Place vaginally. 05/03/22   [provider]     Family History  Problem Relation Age of Onset   Diabetes Mother    Colon cancer Mother    Breast cancer Mother    Cancer Father        carcinoma   Bone cancer Brother    Diabetes Maternal Grandmother     Social History   Socioeconomic History   Marital status: Married    Spouse name: Not on file   Number of children: Not on file   Years of education: Not on file   Highest education level: Not on file  Occupational History   Not on file  Tobacco Use   Smoking status: Former    Types: Cigarettes    Quit date: 1980    Years since quitting: 43.7   Smokeless tobacco: Never   Tobacco comments:    Quit 40 years ago  [employer]  Vaping Use: Never used  Substance and Sexual Activity   Alcohol use: Not Currently    Comment: occational beer   Drug use: Never   Sexual activity: Not Currently  Other Topics Concern   Not on file  Social History Narrative   Not on file   Social Determinants of Health   Financial Resource Strain: Not on file  Food Insecurity: Not on file  Transportation Needs: Not on file  Physical Activity: Not on file  Stress: Not on file  Social Connections: Not on file      Review of Systems: A 12 point  ROS discussed and pertinent positives are indicated in the HPI above.  All other systems are negative.  Review of Systems  Constitutional:  Negative for fatigue and fever.  HENT:  Negative for congestion.   Respiratory:  Negative for cough and shortness of breath.   Gastrointestinal:  Negative for abdominal pain, diarrhea, nausea and vomiting.    Vital Signs: BP 136/78   Pulse 67   Temp 97.9 F (36.6 C) (Oral)   Resp 18   Ht _0  (1.6 m)   Wt 156 lb (70.8 kg)   SpO2 99%   BMI 27.63 kg/m     Physical Exam Vitals and nursing note reviewed.  Constitutional:      Appearance: She is well-developed.  HENT:     Head: Normocephalic and atraumatic.  Eyes:     Conjunctiva/sclera: Conjunctivae normal.  Cardiovascular:     Rate and Rhythm: Normal rate and regular rhythm.     Heart sounds: Normal heart sounds.  Pulmonary:     Effort: Pulmonary effort is normal.     Breath sounds: Normal breath sounds.  Musculoskeletal:        General: Normal range of motion.     Cervical back: Normal range of motion.  Skin:    General: Skin is warm.  Neurological:     Mental Status: She is alert and oriented to person, place, and time.     Imaging: CT Angio Chest Pulmonary Embolism (PE) W or WO Contrast  Result Date: 06/10/2022 CLINICAL DATA:  History of pulmonary embolism. EXAM: CT ANGIOGRAPHY CHEST WITH CONTRAST TECHNIQUE: Multidetector CT imaging of the chest was performed using the standard protocol during bolus administration of intravenous contrast. Multiplanar CT image reconstructions and MIPs were obtained to evaluate the vascular anatomy. RADIATION DOSE REDUCTION: This exam was performed according to the departmental dose-optimization program which includes automated exposure control, adjustment of the mA and/or kV according to patient size and/or use of iterative reconstruction technique. CONTRAST:  91m OMNIPAQUE IOHEXOL 350 MG/ML SOLN COMPARISON:  Jan 22, 2022. FINDINGS:  Cardiovascular: Satisfactory opacification of the pulmonary arteries to the segmental level. No evidence of pulmonary embolism. Normal heart size. No pericardial effusion. Mediastinum/Nodes: No enlarged mediastinal, hilar, or axillary lymph nodes. Thyroid gland, trachea, and esophagus demonstrate no significant findings. Lungs/Pleura: Lungs are clear. No pleural effusion or pneumothorax. Upper Abdomen: No acute abnormality. Musculoskeletal: No chest wall abnormality. No acute or significant osseous findings. Review of the MIP images confirms the above findings. IMPRESSION: No definite evidence of pulmonary embolus. No definite abnormality seen in the chest. Electronically Signed   By: JMarijo ConceptionM.D.   On: 06/10/2022 15:42    Labs:  CBC: Recent Labs    03/06/22 0957 05/16/22 1446 06/06/22 1347 06/14/22 0800  WBC 7.9 6.7 6.4 6.2  HGB 12.6 12.4 12.4 12.3  HCT 38.9 37.6 38.6 38.6  PLT 633*  594* 592* 621*    COAGS: No results for input(s): "INR", "APTT" in the last 8760 hours.  BMP: Recent Labs    01/22/22 1417 03/06/22 0957 05/16/22 1446 06/06/22 1347  NA  --  138 137 136  K  --  3.9 4.0 4.0  CL  --  108 110 110  CO2  --  _0 GLUCOSE  --  95 100* 104*  BUN  --  _1 CALCIUM  --  9.1 9.3 8.8*  CREATININE 1.00 0.78 0.77 0.86  GFRNONAA  --  >60 >60 >60    LIVER FUNCTION TESTS: Recent Labs    03/06/22 0957 05/16/22 1446 06/06/22 1347  BILITOT 0.6 0.4 0.6  AST _2 ALT _3 ALKPHOS 48 50 48  PROT 7.7 7.3 7.3  ALBUMIN 4.2 4.5 4.1      Assessment and Plan:  61 y.o. female outpatient History of hypothyroidism, GERD. Found to have chronic thrombocytopenia and unprovoked PE. Team is requesting a bone marrow biopsy for further evaluation of  Reactive vs Triple negative essential thrombocythemia-myeloproliferative disease.  ll labs are within acceptable parameters.  Patient is on eliquis for PE. No pertinent allergies. Patient has been NPO since  midnight.    Risks and benefits of bone marrow biopsy was discussed with the patient and/or patient's family including, but not limited to bleeding, infection, damage to adjacent structures or low yield requiring additional tests.  All of the questions were answered and there is agreement to proceed.  Consent signed and in chart.   Thank you for this interesting consult.  I greatly enjoyed meeting Robin Choi and look forward to participating in their care.  A copy of this report was sent to the requesting provider on this date.  Electronically Signed: Jacqualine Mau, NP 06/14/2022, 8:15 AM   I spent a total of  30 Minutes   in face to face in clinical consultation, greater than 50% of which was counseling/coordinating care for bone marrow biopsy

## 2022-06-14 ENCOUNTER — Ambulatory Visit
Admission: RE | Admit: 2022-06-14 | Discharge: 2022-06-14 | Disposition: A | Discharge status: Home / Self Care | Payer: 59 | Source: Ambulatory Visit | Attending: Oncology | Admitting: Oncology

## 2022-06-14 ENCOUNTER — Other Ambulatory Visit: Payer: Self-pay

## 2022-06-14 DIAGNOSIS — K219 Gastro-esophageal reflux disease without esophagitis: Secondary | ICD-10-CM | POA: Diagnosis not present

## 2022-06-14 DIAGNOSIS — D75839 Thrombocytosis, unspecified: Secondary | ICD-10-CM

## 2022-06-14 DIAGNOSIS — E039 Hypothyroidism, unspecified: Secondary | ICD-10-CM | POA: Diagnosis not present

## 2022-06-14 LAB — CBC WITH DIFFERENTIAL/PLATELET
Abs Immature Granulocytes: 0.02 10*3/uL (ref 0.00–0.07)
Basophils Absolute: 0.1 10*3/uL (ref 0.0–0.1)
Basophils Relative: 2 %
Eosinophils Absolute: 0.4 10*3/uL (ref 0.0–0.5)
Eosinophils Relative: 6 %
HCT: 38.6 % (ref 36.0–46.0)
Hemoglobin: 12.3 g/dL (ref 12.0–15.0)
Immature Granulocytes: 0 %
Lymphocytes Relative: 27 %
Lymphs Abs: 1.7 10*3/uL (ref 0.7–4.0)
MCH: 28.5 pg (ref 26.0–34.0)
MCHC: 31.9 g/dL (ref 30.0–36.0)
MCV: 89.4 fL (ref 80.0–100.0)
Monocytes Absolute: 0.6 10*3/uL (ref 0.1–1.0)
Monocytes Relative: 9 %
Neutro Abs: 3.4 10*3/uL (ref 1.7–7.7)
Neutrophils Relative %: 56 %
Platelets: 621 10*3/uL — ABNORMAL HIGH (ref 150–400)
RBC: 4.32 MIL/uL (ref 3.87–5.11)
RDW: 14 % (ref 11.5–15.5)
WBC: 6.2 10*3/uL (ref 4.0–10.5)
nRBC: 0 % (ref 0.0–0.2)

## 2022-06-14 MED ORDER — MIDAZOLAM HCL 2 MG/2ML IJ SOLN
INTRAMUSCULAR | Status: AC | PRN
  Administered 2022-06-14: 1 mg via INTRAVENOUS

## 2022-06-14 MED ORDER — MIDAZOLAM HCL 2 MG/2ML IJ SOLN
INTRAMUSCULAR | Status: AC
  Filled 2022-06-14: qty 2

## 2022-06-14 MED ORDER — FENTANYL CITRATE (PF) 100 MCG/2ML IJ SOLN
INTRAMUSCULAR | Status: AC
  Filled 2022-06-14: qty 2

## 2022-06-14 MED ORDER — HEPARIN SOD (PORK) LOCK FLUSH 100 UNIT/ML IV SOLN
INTRAVENOUS | Status: AC
  Filled 2022-06-14: qty 5

## 2022-06-14 MED ORDER — FENTANYL CITRATE (PF) 100 MCG/2ML IJ SOLN
INTRAMUSCULAR | Status: AC | PRN
  Administered 2022-06-14: 50 ug via INTRAVENOUS

## 2022-06-14 MED ORDER — SODIUM CHLORIDE 0.9 % IV SOLN: INTRAVENOUS | Status: DC

## 2022-06-14 NOTE — Procedures (Signed)
Interventional Radiology Procedure Note  Procedure: CT guided aspirate and core biopsy of right posterior iliac bone Complications: None Recommendations: - Bedrest supine x 1 hrs - OTC's PRN  Pain - Follow biopsy results  Signed,  Treyvon Blahut S. Hevin Jeffcoat, DO    

## 2022-06-18 LAB — SURGICAL PATHOLOGY

## 2022-06-28 ENCOUNTER — Encounter: Payer: Self-pay | Admitting: Oncology

## 2022-06-28 ENCOUNTER — Inpatient Hospital Stay: Payer: 59 | Attending: Oncology | Admitting: Oncology

## 2022-06-28 VITALS — BP 123/68 | HR 75 | Temp 98.7°F | Resp 18 | Wt 153.1 lb

## 2022-06-28 DIAGNOSIS — I2699 Other pulmonary embolism without acute cor pulmonale: Secondary | ICD-10-CM | POA: Insufficient documentation

## 2022-06-28 DIAGNOSIS — R1012 Left upper quadrant pain: Secondary | ICD-10-CM | POA: Insufficient documentation

## 2022-06-28 DIAGNOSIS — D75839 Thrombocytosis, unspecified: Secondary | ICD-10-CM | POA: Diagnosis present

## 2022-06-28 DIAGNOSIS — Z7189 Other specified counseling: Secondary | ICD-10-CM | POA: Diagnosis not present

## 2022-06-28 DIAGNOSIS — Z87891 Personal history of nicotine dependence: Secondary | ICD-10-CM | POA: Diagnosis not present

## 2022-06-28 DIAGNOSIS — Z87440 Personal history of urinary (tract) infections: Secondary | ICD-10-CM | POA: Insufficient documentation

## 2022-06-28 DIAGNOSIS — D471 Chronic myeloproliferative disease: Secondary | ICD-10-CM

## 2022-06-28 DIAGNOSIS — Z7901 Long term (current) use of anticoagulants: Secondary | ICD-10-CM | POA: Insufficient documentation

## 2022-06-28 DIAGNOSIS — Z808 Family history of malignant neoplasm of other organs or systems: Secondary | ICD-10-CM | POA: Insufficient documentation

## 2022-06-28 DIAGNOSIS — Z86711 Personal history of pulmonary embolism: Secondary | ICD-10-CM

## 2022-06-28 DIAGNOSIS — D473 Essential (hemorrhagic) thrombocythemia: Secondary | ICD-10-CM | POA: Insufficient documentation

## 2022-06-28 DIAGNOSIS — Z800 Family history of malignant neoplasm of digestive organs: Secondary | ICD-10-CM | POA: Diagnosis not present

## 2022-06-28 DIAGNOSIS — Z803 Family history of malignant neoplasm of breast: Secondary | ICD-10-CM | POA: Diagnosis not present

## 2022-06-28 MED ORDER — HYDROXYUREA 500 MG PO CAPS: 500.0000 mg | ORAL_CAPSULE | Freq: Every day | ORAL | 0 refills | Status: DC

## 2022-06-28 MED ORDER — APIXABAN 5 MG PO TABS: ORAL_TABLET | ORAL | 2 refills | Status: DC

## 2022-06-28 NOTE — Assessment & Plan Note (Signed)
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 Recommend cytoreduction with hydroxyurea 500 mg daily.  Titrate dose according to platelet count response. Rationale and potential side effects were reviewed and discussed with patient and husband.  They agree with the plan.  Prescription sent.

## 2022-06-28 NOTE — Assessment & Plan Note (Signed)
Discussed with patient

## 2022-06-28 NOTE — Assessment & Plan Note (Signed)
Recommend continue Eliquis 5 mg twice daily.

## 2022-06-28 NOTE — Progress Notes (Addendum)
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 Recommend cytoreduction with hydroxyurea 500 mg daily.  Titrate dose according to platelet count response. Rationale and potential side effects were reviewed and discussed with patient and husband.  They agree with the plan.  Prescription sent.  Acute pulmonary embolism without acute cor pulmonale (HCC) Recommend continue Eliquis 5 mg twice daily.  Goals of care, counseling/discussion Discussed with patient.   Orders Placed This Encounter  Procedures   CBC with Differential/Platelet    Standing Status:   Future    Standing Expiration Date:   06/29/2023   Comprehensive metabolic panel    Standing Status:   Future    Standing Expiration Date:   06/28/2023   CBC with Differential/Platelet    Standing Status:   Future    Standing Expiration Date:   06/29/2023   Comprehensive metabolic panel    Standing Status:   Future    Standing Expiration Date:   06/28/2023   Follow-up  2 weeks Labs 4 weeks lab MD All questions were answered. The patient knows to call the clinic with any problems, questions or concerns.  Earlie Server, MD, PhD [facility] Hematology Oncology 06/28/2022      CHIEF COMPLAINTS/REASON FOR VISIT:  thrombocytosis  HISTORY OF PRESENTING ILLNESS:   Robin Choi is a  61 y.o.  female with PMH listed below was seen in consultation at the request of  Mary[place] Pink, MD  for evaluation of thrombocytosis  For the past few months, patient reports lower abdominal pressure, dysuria symptoms.  She feels like "sitting on a ball"  patient has had evaluation by PCP and  gynecology.  Patient reports frequent history of UTIs in the past. Patient had negative Pap smear, wet prep and ultrasound done by Dr. Chrystine Oiler. Dr. Leafy Ro reviewed the ultrasound results and images with patient.  No abnormal findings.  Her bimanual examination revealed cervical tenderness, uterine tenderness and minimal obturator tenderness and +bilateral pudendal nerves were tender.  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  12/11/2021, CBC showed a platelet count of.  804,000. 12/17/2021, platelet count 795,000 12/27/2021, platelet count 872,000.  Patient reports having life stressors.  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  She has to lift him multiple times in order to provide care.  She lives with her husband and brother-in-law.  Husband is on disability and not able to lift patient  Family history positive for father with carcinoma discovered in his neck, details unknown, breast cancer, colon cancer.She is overdue for colonoscopy.  She feels she could not get anyone to take care of brother-in-law during weekdays.  Oncology History  Essential thrombocythemia ([place])  01/22/2022 Imaging   CT angio chest pulmonary embolism with or without contrast showed Small volume nonocclusive pulmonary embolism in a posterior basilar left lower lobe pulmonary artery branch.     01/22/2022 Imaging   Bilateral lower extremity ultrasound negative for DVT.    02/04/2022 Imaging   CT abdomen pelvis with contrast negative, no acute findings or other significant abnormality.  03/21/2022 Imaging   MRI abdomen with and without contrast showed no acute process or explanation for left upper quadrant pain.  Aortic atherosclerosis.   06/10/2022 Imaging   CT chest angiogram PE protocol treatment no definite evidence of pulmonary embolism.  No definite  abnormality seen in the chest.   06/14/2022 Initial Diagnosis   Essential thrombocythemia -  Peripheral blood JAK2 V617F mutation negative, with reflex to other mutations CALR, MPL, JAK 2 Ex 12-15 mutations negative.  Onkosight advanced NGS JAK2, MPL, CALR reflext to myeloid panel, negative,    bone marrow biopsy aspirate showed -  No immunophenotypic evidence of a lymphoproliferative disorder (no  monoclonal B cells or immunophenotypically abnormal T cells detected).  -  1% CD34 positive blasts  Bone marrow aspirate: Clot core -  Overall normocellular bone marrow with megakaryocytic hyperplasia with hyperlobulated/atypical forms with clustering  Morphologically there is a megakaryocytic hyperplasia with  hyper lobulation/hypersegmentation to the megakaryocytes in the absence  of a prominent panmyelosis.  Peripheral blood testing was negative for  the common 3 mutations seen in essential thrombocytosis and primary  myelofibrosis (JAK2, MPL and CALR).  While this could still be secondary to an underlying reactive cause the overall morphology favors a primary myeloproliferative neoplasm such as essential thrombocytosis or less likely prefibrotic primary myelofibrosis.     INTERVAL HISTORY Robin Choi is a 61 y.o. female who has above history reviewed by me today presents for follow up visit for essential thrombocythemia.  Patient was accompanied by husband. + She continues to have left upper quadrant pain.  She has had CT scan and MRI which did not explain her pain. Patient takes Eliquis 5 mg twice daily.  She tolerates the treatment.  Denies any bleeding events.   Review of Systems  Constitutional:  Negative for appetite change, chills, fatigue and fever.  HENT:   Negative for hearing loss and voice change.   Eyes:  Negative for eye problems.  Respiratory:  Negative for chest tightness and cough.   Cardiovascular:  Negative for chest pain.  Gastrointestinal:  Negative for  abdominal distention and blood in stool.       Abdomen/pelvic pain,   Endocrine: Negative for hot flashes.  Genitourinary:  Negative for difficulty urinating and frequency.   Musculoskeletal:  Negative for arthralgias.  Skin:  Negative for itching and rash.  Neurological:  Negative for dizziness, extremity weakness and headaches.  Hematological:  Negative for adenopathy.  Psychiatric/Behavioral:  Negative for confusion.     MEDICAL HISTORY:  Past Medical History:  Diagnosis Date   GERD (gastroesophageal reflux disease)    Hypothyroidism     SURGICAL HISTORY: Past Surgical History:  Procedure Laterality Date   COLONOSCOPY WITH PROPOFOL N/A 01/15/2022   Procedure: COLONOSCOPY WITH PROPOFOL;  Surgeon: Jonathon Bellows, MD;  Location: [facility];  Service: Gastroenterology;  Laterality: N/A;  Needs an early appointment.  Has disabled brother and easier to get sitter early in the morning.   NOVASURE ABLATION     OTHER SURGICAL HISTORY     "tubal cauterization"    SOCIAL HISTORY: Social History   Socioeconomic History   Marital status: Married    Spouse name: Not on file   Number of children: Not on file   Years of education: Not on file   Highest education level: Not on file  Occupational History   Not on file  Tobacco Use   Smoking status: Former    Types: Cigarettes    Quit date: 17  Years since quitting: 43.7   Smokeless tobacco: Never   Tobacco comments:    Quit 40 years ago  Vaping Use   Vaping Use: Never used  Substance and Sexual Activity   Alcohol use: Not Currently    Comment: occational beer   Drug use: Never   Sexual activity: Not Currently  Other Topics Concern   Not on file  Social History Narrative   Not on file   Social Determinants of Health   Financial Resource Strain: Not on file  Food Insecurity: Not on file  Transportation Needs: Not on file  Physical Activity: Not on file  Stress: Not on file  Social Connections: Not on file   Intimate Partner Violence: Not on file    FAMILY HISTORY: Family History  Problem Relation Age of Onset   Diabetes Mother    Colon cancer Mother    Breast cancer Mother    Cancer Father        carcinoma   Bone cancer Brother    Diabetes Maternal Grandmother     ALLERGIES:  is allergic to celecoxib, nitrofurantoin macrocrystal, nitrofurantoin, and sulfa antibiotics.  MEDICATIONS:  Current Outpatient Medications  Medication Sig Dispense Refill   almotriptan (AXERT) 12.5 MG tablet      ARMOUR THYROID 60 MG tablet Take 60 mg by mouth daily.     azithromycin (ZITHROMAX) 250 MG tablet Take 2 tablets (550m) by mouth on Day 1. Take 1 tablet (2560m by mouth on Days 2-5.     cetirizine (ZYRTEC) 10 MG tablet Take by mouth.     clobetasol cream (TEMOVATE) 0.05 % Apply to affected areas hands 1-2 times a day until rash improved. Avoid face, groin, underarms. 30 g 1   ergocalciferol (VITAMIN D2) 1.25 MG (50000 UT) capsule Take by mouth.     hydroxyurea (HYDREA) 500 MG capsule Take 1 capsule (500 mg total) by mouth daily. May take with food to minimize GI side effects. 50 capsule 0   ketoconazole (NIZORAL) 2 % cream Apply to affected areas body folds 1-2 times a day as needed until rash improved. 60 g 2   lansoprazole (PREVACID) 15 MG capsule      topiramate (TOPAMAX) 50 MG tablet Take 50 mg by mouth 2 (two) times daily.     valACYclovir (VALTREX) 1000 MG tablet      YUVAFEM 10 MCG TABS vaginal tablet Place vaginally.     apixaban (ELIQUIS) 5 MG TABS tablet Take 1 tablet (8m60mtwice daily 60 tablet 2   No current facility-administered medications for this visit.     PHYSICAL EXAMINATION: ECOG PERFORMANCE STATUS: 1 - Symptomatic but completely ambulatory Vitals:   06/28/22 1145  BP: 123/68  Pulse: 75  Resp: 18  Temp: 98.7 F (37.1 C)   Filed Weights   06/28/22 1145  Weight: 153 lb 1.6 oz (69.4 kg)    Physical Exam Constitutional:      General: She is not in acute  distress. HENT:     Head: Normocephalic and atraumatic.  Eyes:     General: No scleral icterus. Cardiovascular:     Rate and Rhythm: Normal rate.  Pulmonary:     Effort: Pulmonary effort is normal. No respiratory distress.  Abdominal:     General: There is no distension.  Musculoskeletal:        General: No deformity. Normal range of motion.     Cervical back: Normal range of motion and neck supple.  Skin:    General: Skin  is warm and dry.     Findings: No erythema or rash.  Neurological:     Mental Status: She is alert and oriented to person, place, and time. Mental status is at baseline.  Psychiatric:        Mood and Affect: Mood normal.     LABORATORY DATA:  I have reviewed the data as listed    Latest Ref Rng & Units 06/14/2022    8:00 AM 06/06/2022    1:47 PM 05/16/2022    2:46 PM  CBC  WBC 4.0 - 10.5 K/uL 6.2  6.4  6.7   Hemoglobin 12.0 - 15.0 g/dL 12.3  12.4  12.4   Hematocrit 36.0 - 46.0 % 38.6  38.6  37.6   Platelets 150 - 400 K/uL 621  592  594       Latest Ref Rng & Units 06/06/2022    1:47 PM 05/16/2022    2:46 PM 03/06/2022    9:57 AM  CMP  Glucose 70 - 99 mg/dL 104  100  95   BUN 8 - 23 mg/dL '17  14  13   ' Creatinine 0.44 - 1.00 mg/dL 0.86  0.77  0.78   Sodium 135 - 145 mmol/L 136  137  138   Potassium 3.5 - 5.1 mmol/L 4.0  4.0  3.9   Chloride 98 - 111 mmol/L 110  110  108   CO2 22 - 32 mmol/L '22  24  24   ' Calcium 8.9 - 10.3 mg/dL 8.8  9.3  9.1   Total Protein 6.5 - 8.1 g/dL 7.3  7.3  7.7   Total Bilirubin 0.3 - 1.2 mg/dL 0.6  0.4  0.6   Alkaline Phos 38 - 126 U/L 48  50  48   AST 15 - 41 U/L '17  15  18   ' ALT 0 - 44 U/L '17  14  18     ' Iron/TIBC/Ferritin/ %Sat    Component Value Date/Time   IRON 62 05/16/2022 1446   TIBC 288 05/16/2022 1446   FERRITIN 76 05/16/2022 1448   IRONPCTSAT 22 05/16/2022 1446      RADIOGRAPHIC STUDIES: I have personally reviewed the radiological images as listed and agreed with the findings in the report. CT BONE  MARROW BIOPSY & ASPIRATION  Result Date: 06/14/2022 INDICATION: 61 year old female referred for bone marrow biopsy EXAM: CT BONE MARROW BIOPSY AND ASPIRATION MEDICATIONS: None. ANESTHESIA/SEDATION: Moderate (conscious) sedation was employed during this procedure. A total of Versed 1.0 mg and Fentanyl 50 mcg was administered intravenously. Moderate Sedation Time: 11 minutes. The patient's level of consciousness and vital signs were monitored continuously by radiology nursing throughout the procedure under my direct supervision. FLUOROSCOPY TIME:  CT COMPLICATIONS: None PROCEDURE: Informed written consent was obtained from the patient after a thorough discussion of the procedural risks, benefits and alternatives. All questions were addressed. Maximal Sterile Barrier Technique was utilized including caps, mask, sterile gowns, sterile gloves, sterile drape, hand hygiene and skin antiseptic. A timeout was performed prior to the initiation of the procedure. Scout CT of the pelvis was performed for surgical planning purposes. The posterior pelvis was prepped with Chlorhexidine in a sterile fashion, and a sterile drape was applied covering the operative field. A sterile gown and sterile gloves were used for the procedure. Local anesthesia was provided with 1% Lidocaine. Posterior right iliac bone was targeted for biopsy. The skin and subcutaneous tissues were infiltrated with 1% lidocaine without epinephrine. A small stab incision was  made with an 11 blade scalpel, and an 11 gauge Murphy needle was advanced with CT guidance to the posterior cortex. Manual forced was used to advance the needle through the posterior cortex and the stylet was removed. A bone marrow aspirate was retrieved and passed to a cytotechnologist in the room. The Murphy needle was then advanced without the stylet for a core biopsy. The core biopsy was retrieved and also passed to a cytotechnologist. Manual pressure was used for hemostasis and a  sterile dressing was placed. No complications were encountered no significant blood loss was encountered. Patient tolerated the procedure well and remained hemodynamically stable throughout. IMPRESSION: Status post CT-guided bone marrow biopsy, with tissue specimen sent to pathology for complete histopathologic analysis Signed, Dulcy Fanny. Nadene Rubins, RPVI [facility] Electronically Signed   By: Corrie Mckusick D.O.   On: 06/14/2022 09:19   CT Angio Chest Pulmonary Embolism (PE) W or WO Contrast  Result Date: 06/10/2022 CLINICAL DATA:  History of pulmonary embolism. EXAM: CT ANGIOGRAPHY CHEST WITH CONTRAST TECHNIQUE: Multidetector CT imaging of the chest was performed using the standard protocol during bolus administration of intravenous contrast. Multiplanar CT image reconstructions and MIPs were obtained to evaluate the vascular anatomy. RADIATION DOSE REDUCTION: This exam was performed according to the departmental dose-optimization program which includes automated exposure control, adjustment of the mA and/or kV according to patient size and/or use of iterative reconstruction technique. CONTRAST:  57m OMNIPAQUE IOHEXOL 350 MG/ML SOLN COMPARISON:  Jan 22, 2022. FINDINGS: Cardiovascular: Satisfactory opacification of the pulmonary arteries to the segmental level. No evidence of pulmonary embolism. Normal heart size. No pericardial effusion. Mediastinum/Nodes: No enlarged mediastinal, hilar, or axillary lymph nodes. Thyroid gland, trachea, and esophagus demonstrate no significant findings. Lungs/Pleura: Lungs are clear. No pleural effusion or pneumothorax. Upper Abdomen: No acute abnormality. Musculoskeletal: No chest wall abnormality. No acute or significant osseous findings. Review of the MIP images confirms the above findings. IMPRESSION: No definite evidence of pulmonary embolus. No definite abnormality seen in the chest. Electronically Signed    By: JMarijo ConceptionM.D.   On: 06/10/2022 15:42

## 2022-07-05 ENCOUNTER — Telehealth: Payer: Self-pay | Admitting: *Deleted

## 2022-07-05 NOTE — Telephone Encounter (Addendum)
Patient called wanting to know if the fact that she stopped donating blood is the reason that her platelet count has gone up. Please return her call as she has questions about her new diagnosis of ET

## 2022-07-05 NOTE — Telephone Encounter (Signed)
Returned to patient and informed of doctor response. She said she was just trying to figure out why all of the sudden she has this.

## 2022-07-12 ENCOUNTER — Inpatient Hospital Stay: Payer: 59

## 2022-07-12 DIAGNOSIS — D471 Chronic myeloproliferative disease: Secondary | ICD-10-CM

## 2022-07-12 DIAGNOSIS — D473 Essential (hemorrhagic) thrombocythemia: Secondary | ICD-10-CM | POA: Diagnosis not present

## 2022-07-12 LAB — COMPREHENSIVE METABOLIC PANEL WITH GFR
ALT: 22 U/L (ref 0–44)
AST: 21 U/L (ref 15–41)
Albumin: 4.1 g/dL (ref 3.5–5.0)
Alkaline Phosphatase: 43 U/L (ref 38–126)
Anion gap: 4 — ABNORMAL LOW (ref 5–15)
BUN: 15 mg/dL (ref 8–23)
CO2: 24 mmol/L (ref 22–32)
Calcium: 9.2 mg/dL (ref 8.9–10.3)
Chloride: 110 mmol/L (ref 98–111)
Creatinine, Ser: 0.77 mg/dL (ref 0.44–1.00)
GFR, Estimated: >60 mL/min
Glucose, Bld: 96 mg/dL (ref 70–99)
Potassium: 4.5 mmol/L (ref 3.5–5.1)
Sodium: 138 mmol/L (ref 135–145)
Total Bilirubin: 0.5 mg/dL (ref 0.3–1.2)
Total Protein: 7.8 g/dL (ref 6.5–8.1)

## 2022-07-12 LAB — CBC WITH DIFFERENTIAL/PLATELET
Abs Immature Granulocytes: 0.03 10*3/uL (ref 0.00–0.07)
Basophils Absolute: 0.1 10*3/uL (ref 0.0–0.1)
Basophils Relative: 1 %
Eosinophils Absolute: 0.3 10*3/uL (ref 0.0–0.5)
Eosinophils Relative: 4 %
HCT: 40.3 % (ref 36.0–46.0)
Hemoglobin: 12.6 g/dL (ref 12.0–15.0)
Immature Granulocytes: 0 %
Lymphocytes Relative: 23 %
Lymphs Abs: 1.6 10*3/uL (ref 0.7–4.0)
MCH: 28.7 pg (ref 26.0–34.0)
MCHC: 31.3 g/dL (ref 30.0–36.0)
MCV: 91.8 fL (ref 80.0–100.0)
Monocytes Absolute: 0.5 10*3/uL (ref 0.1–1.0)
Monocytes Relative: 8 %
Neutro Abs: 4.4 10*3/uL (ref 1.7–7.7)
Neutrophils Relative %: 64 %
Platelets: 544 10*3/uL — ABNORMAL HIGH (ref 150–400)
RBC: 4.39 MIL/uL (ref 3.87–5.11)
RDW: 14.2 % (ref 11.5–15.5)
WBC: 7 10*3/uL (ref 4.0–10.5)
nRBC: 0 % (ref 0.0–0.2)

## 2022-08-02 ENCOUNTER — Other Ambulatory Visit: Payer: 59

## 2022-08-02 ENCOUNTER — Ambulatory Visit: Payer: 59 | Admitting: Oncology

## 2022-08-05 ENCOUNTER — Inpatient Hospital Stay (HOSPITAL_BASED_OUTPATIENT_CLINIC_OR_DEPARTMENT_OTHER): Payer: 59 | Admitting: Oncology

## 2022-08-05 ENCOUNTER — Inpatient Hospital Stay: Payer: 59 | Attending: Oncology

## 2022-08-05 ENCOUNTER — Encounter: Payer: Self-pay | Admitting: Oncology

## 2022-08-05 VITALS — BP 127/71 | HR 72 | Temp 97.8°F | Resp 18 | Wt 157.3 lb

## 2022-08-05 DIAGNOSIS — Z803 Family history of malignant neoplasm of breast: Secondary | ICD-10-CM | POA: Diagnosis not present

## 2022-08-05 DIAGNOSIS — D75839 Thrombocytosis, unspecified: Secondary | ICD-10-CM | POA: Diagnosis present

## 2022-08-05 DIAGNOSIS — Z86711 Personal history of pulmonary embolism: Secondary | ICD-10-CM

## 2022-08-05 DIAGNOSIS — D471 Chronic myeloproliferative disease: Secondary | ICD-10-CM | POA: Diagnosis not present

## 2022-08-05 DIAGNOSIS — Z87440 Personal history of urinary (tract) infections: Secondary | ICD-10-CM | POA: Insufficient documentation

## 2022-08-05 DIAGNOSIS — Z7901 Long term (current) use of anticoagulants: Secondary | ICD-10-CM | POA: Diagnosis not present

## 2022-08-05 DIAGNOSIS — D473 Essential (hemorrhagic) thrombocythemia: Secondary | ICD-10-CM | POA: Diagnosis not present

## 2022-08-05 LAB — CBC WITH DIFFERENTIAL/PLATELET
Abs Immature Granulocytes: 0.01 10*3/uL (ref 0.00–0.07)
Basophils Absolute: 0.1 10*3/uL (ref 0.0–0.1)
Basophils Relative: 1 %
Eosinophils Absolute: 0.2 10*3/uL (ref 0.0–0.5)
Eosinophils Relative: 4 %
HCT: 39.3 % (ref 36.0–46.0)
Hemoglobin: 12.6 g/dL (ref 12.0–15.0)
Immature Granulocytes: 0 %
Lymphocytes Relative: 34 %
Lymphs Abs: 2 10*3/uL (ref 0.7–4.0)
MCH: 29.7 pg (ref 26.0–34.0)
MCHC: 32.1 g/dL (ref 30.0–36.0)
MCV: 92.7 fL (ref 80.0–100.0)
Monocytes Absolute: 0.5 10*3/uL (ref 0.1–1.0)
Monocytes Relative: 9 %
Neutro Abs: 3.1 10*3/uL (ref 1.7–7.7)
Neutrophils Relative %: 52 %
Platelets: 569 10*3/uL — ABNORMAL HIGH (ref 150–400)
RBC: 4.24 MIL/uL (ref 3.87–5.11)
RDW: 16.4 % — ABNORMAL HIGH (ref 11.5–15.5)
WBC: 6 10*3/uL (ref 4.0–10.5)
nRBC: 0 % (ref 0.0–0.2)

## 2022-08-05 LAB — COMPREHENSIVE METABOLIC PANEL
ALT: 21 U/L (ref 0–44)
AST: 18 U/L (ref 15–41)
Albumin: 4.4 g/dL (ref 3.5–5.0)
Alkaline Phosphatase: 49 U/L (ref 38–126)
Anion gap: 7 (ref 5–15)
BUN: 12 mg/dL (ref 8–23)
CO2: 24 mmol/L (ref 22–32)
Calcium: 9 mg/dL (ref 8.9–10.3)
Chloride: 107 mmol/L (ref 98–111)
Creatinine, Ser: 0.8 mg/dL (ref 0.44–1.00)
GFR, Estimated: >60 mL/min (ref >60)
Glucose, Bld: 100 mg/dL — ABNORMAL HIGH (ref 70–99)
Potassium: 4.2 mmol/L (ref 3.5–5.1)
Sodium: 138 mmol/L (ref 135–145)
Total Bilirubin: 0.5 mg/dL (ref 0.3–1.2)
Total Protein: 7.4 g/dL (ref 6.5–8.1)

## 2022-08-05 MED ORDER — HYDROXYUREA 500 MG PO CAPS: 500.0000 mg | ORAL_CAPSULE | ORAL | 0 refills | Status: DC

## 2022-08-05 NOTE — Assessment & Plan Note (Signed)
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 Labs are reviewed and discussed with patient. Platelet counts in 500s.  Recommend to increase Hydroxyurea to 1000 mg x 1 day per week and hydroxyurea 516m daily for the rest of the week. New Rx sent to pharmacy

## 2022-08-05 NOTE — Progress Notes (Signed)
Pt here for follow up. Pt reports that she has increased soreness to body. Pt also reports numb spots to legs

## 2022-08-05 NOTE — Assessment & Plan Note (Signed)
Unprovoked, negative factor V Leiden mutation.  Negative prothrombin gene mutation Negative cardiolipin antibody, negative beta glycoprotein antibodies Continue Eliquis 5 mg twice daily Discussed about the plan of decreasing to Eliquis 2.'5mg'$  BID once her platelet counts are better controlled.

## 2022-08-05 NOTE — Progress Notes (Signed)
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 Labs are reviewed and discussed with patient. Platelet counts in 500s.  Recommend to increase Hydroxyurea to 1000 mg x 1 day per week and hydroxyurea 584m daily for the rest of the week. New Rx sent to pharmacy  History of pulmonary embolism Unprovoked, negative factor V Leiden mutation.  Negative prothrombin gene mutation Negative cardiolipin antibody, negative beta glycoprotein antibodies Continue Eliquis 5 mg twice daily Discussed about the plan of decreasing to Eliquis 2.520mBID once her platelet counts are better controlled.  Recommend influenza vaccination, patient declined.  Orders Placed This Encounter  Procedures   CBC with Differential/Platelet    Standing Status:   Future    Standing Expiration Date:   08/06/2023   CBC with Differential/Platelet    Standing Status:   Future    Standing Expiration Date:   08/06/2023   Comprehensive metabolic panel    Standing Status:   Future    Standing Expiration Date:   08/05/2023   Follow-up  Lab in 4 weeks - cbc 2 months lab MD cbc cmp   All questions were answered. The patient knows to call the clinic with any problems, questions or concerns.  ZhEarlie ServerMD, PhD [facility] 08/05/2022      CHIEF COMPLAINTS/REASON FOR VISIT:  thrombocytosis  HISTORY OF PRESENTING ILLNESS:   Robin Choi a  [age].o.  female with PMH listed below was seen in consultation at the request of  HeMaryland PinkMD  for evaluation of thrombocytosis  For the past few months, patient reports lower abdominal  pressure, dysuria symptoms.  She feels like "sitting on a ball"  patient has had evaluation by PCP and gynecology.  Patient reports frequent history of UTIs in the past. Patient had negative Pap smear, wet prep and ultrasound done by Dr. HeChrystine OilerDr. BeLeafy Roeviewed the ultrasound results and images with patient.  No abnormal findings.  Her bimanual examination revealed cervical tenderness, uterine tenderness and minimal obturator tenderness and +bilateral pudendal nerves were tender.  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  12/11/2021, CBC showed a platelet count of.  804,000. 12/17/2021, platelet count 795,000 12/27/2021, platelet count 872,000.  Patient reports having life stressors.  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  She has to lift him multiple times in order to provide care.  She lives with her husband and brother-in-law.  Husband is on disability and not able to lift patient  Family history positive for father with carcinoma discovered in his neck, details unknown, breast cancer, colon cancer.She is overdue for colonoscopy.  She feels she could not get anyone to take care of brother-in-law during weekdays.  Oncology History  [place]   CT angio chest pulmonary embolism with or without contrast showed Small volume nonocclusive pulmonary embolism in a posterior basilar left lower lobe pulmonary artery branch.     01/22/2022 Imaging   Bilateral lower extremity ultrasound negative for DVT.    02/04/2022 Imaging  CT abdomen pelvis with contrast negative, no acute findings or other significant abnormality.   03/21/2022 Imaging   MRI abdomen with and without contrast showed no acute process or explanation for left upper quadrant pain.  Aortic atherosclerosis.   06/10/2022  Imaging   CT chest angiogram PE protocol treatment no definite evidence of pulmonary embolism.  No definite abnormality seen in the chest.   06/14/2022 Initial Diagnosis   Essential thrombocythemia -  Peripheral blood JAK2 V617F mutation negative, with reflex to other mutations CALR, MPL, JAK 2 Ex 12-15 mutations negative.  Onkosight advanced NGS JAK2, MPL, CALR reflext to myeloid panel, negative,    bone marrow biopsy aspirate showed -  No immunophenotypic evidence of a lymphoproliferative disorder (no  monoclonal B cells or immunophenotypically abnormal T cells detected).  -  1% CD34 positive blasts  Bone marrow aspirate: Clot core -  Overall normocellular bone marrow with megakaryocytic hyperplasia with hyperlobulated/atypical forms with clustering  Morphologically there is a megakaryocytic hyperplasia with  hyper lobulation/hypersegmentation to the megakaryocytes in the absence  of a prominent panmyelosis.  Peripheral blood testing was negative for  the common 3 mutations seen in essential thrombocytosis and primary  myelofibrosis (JAK2, MPL and CALR).  While this could still be secondary to an underlying reactive cause the overall morphology favors a primary myeloproliferative neoplasm such as essential thrombocytosis or less likely prefibrotic primary myelofibrosis.     INTERVAL HISTORY Robin Choi is a 61 y.o. female who has above history reviewed by me today presents for follow up visit for essential thrombocythemia.  Patient was here by herself.  She take hydroxyurea 550m daily, she tolerates well.  +chronic left upper quadrant pain.  She has had CT scan and MRI which did not explain her pain. Patient takes Eliquis 5 mg twice daily.   Denies any bleeding events.   Review of Systems  Constitutional:  Negative for appetite change, chills, fatigue and fever.  HENT:   Negative for hearing loss and voice change.   Eyes:  Negative for eye problems.  Respiratory:  Negative  for chest tightness and cough.   Cardiovascular:  Negative for chest pain.  Gastrointestinal:  Negative for abdominal distention and blood in stool.       Abdomen/pelvic pain,   Endocrine: Negative for hot flashes.  Genitourinary:  Negative for difficulty urinating and frequency.   Musculoskeletal:  Negative for arthralgias.  Skin:  Negative for itching and rash.  Neurological:  Negative for dizziness, extremity weakness and headaches.  Hematological:  Negative for adenopathy.  Psychiatric/Behavioral:  Negative for confusion.     MEDICAL HISTORY:  Past Medical History:  Diagnosis Date   GERD (gastroesophageal reflux disease)    Hypothyroidism     SURGICAL HISTORY: Past Surgical History:  Procedure Laterality Date   COLONOSCOPY WITH PROPOFOL N/A 01/15/2022   Procedure: COLONOSCOPY WITH PROPOFOL;  Surgeon: AJonathon Bellows MD;  [facility];  Service: Gastroenterology;  Laterality: N/A;  Needs an early appointment.  Has disabled brother and easier to get sitter early in the morning.   NOVASURE ABLATION     OTHER SURGICAL HISTORY     "tubal cauterization"    SOCIAL HISTORY: Social History   Socioeconomic History   Marital status: Married    Spouse name: Not on file   Number of children: Not on file   Years of education: Not on file   Highest education level: Not on file  Occupational History   Not on file  Tobacco Use  Smoking status: Former    Types: Cigarettes    Quit date: 1980    Years since quitting: 43.8   Smokeless tobacco: Never   Tobacco comments:    Quit 40 years ago  Vaping Use   Vaping Use: Never used  Substance and Sexual Activity   Alcohol use: Not Currently    Comment: occational beer   Drug use: Never   Sexual activity: Not Currently  Other Topics Concern   Not on file  Social History Narrative   Not on file   Social Determinants of Health   Financial Resource Strain: Not on file  Food Insecurity: Not on file  Transportation Needs:  Not on file  Physical Activity: Not on file  Stress: Not on file  Social Connections: Not on file  Intimate Partner Violence: Not on file    FAMILY HISTORY: Family History  Problem Relation Age of Onset   Diabetes Mother    Colon cancer Mother    Breast cancer Mother    Cancer Father        carcinoma   Bone cancer Brother    Diabetes Maternal Grandmother     ALLERGIES:  is allergic to celecoxib, nitrofurantoin macrocrystal, nitrofurantoin, and sulfa antibiotics.  MEDICATIONS:  Current Outpatient Medications  Medication Sig Dispense Refill   apixaban (ELIQUIS) 5 MG TABS tablet Take 1 tablet (23m) twice daily 60 tablet 2   ARMOUR THYROID 60 MG tablet Take 60 mg by mouth daily.     cetirizine (ZYRTEC) 10 MG tablet Take by mouth.     clobetasol cream (TEMOVATE) 0.05 % Apply to affected areas hands 1-2 times a day until rash improved. Avoid face, groin, underarms. 30 g 1   ergocalciferol (VITAMIN D2) 1.25 MG (50000 UT) capsule Take 50,000 Units by mouth once a week.     ketoconazole (NIZORAL) 2 % cream Apply to affected areas body folds 1-2 times a day as needed until rash improved. 60 g 2   topiramate (TOPAMAX) 50 MG tablet Take 50 mg by mouth 2 (two) times daily.     valACYclovir (VALTREX) 1000 MG tablet      YUVAFEM 10 MCG TABS vaginal tablet Place vaginally.     almotriptan (AXERT) 12.5 MG tablet  (Patient not taking: Reported on 08/05/2022)     hydroxyurea (HYDREA) 500 MG capsule Take 1 capsule (500 mg total) by mouth See admin instructions. May take with food to minimize GI side effects. Take 1000 mg on every Wednesday, and take 5061mdaily for the rest of the week. 90 capsule 0   lansoprazole (PREVACID) 15 MG capsule  (Patient not taking: Reported on 08/05/2022)     No current facility-administered medications for this visit.     PHYSICAL EXAMINATION: ECOG PERFORMANCE STATUS: 1 - Symptomatic but completely ambulatory Vitals:   08/05/22 1334  BP: 127/71  Pulse: 72   Resp: 18  Temp: 97.8 F (36.6 C)   Filed Weights   08/05/22 1334  Weight: 157 lb 4.8 oz (71.4 kg)    Physical Exam Constitutional:      General: She is not in acute distress. HENT:     Head: Normocephalic and atraumatic.  Eyes:     General: No scleral icterus. Cardiovascular:     Rate and Rhythm: Normal rate.  Pulmonary:     Effort: Pulmonary effort is normal. No respiratory distress.  Abdominal:     General: There is no distension.  Musculoskeletal:        General: No  deformity. Normal range of motion.     Cervical back: Normal range of motion and neck supple.  Skin:    General: Skin is warm and dry.     Findings: No erythema or rash.  Neurological:     Mental Status: She is alert and oriented to person, place, and time. Mental status is at baseline.  Psychiatric:        Mood and Affect: Mood normal.     LABORATORY DATA:  I have reviewed the data as listed    Latest Ref Rng & Units 08/05/2022    1:21 PM 07/12/2022   10:52 AM 06/14/2022    8:00 AM  CBC  WBC 4.0 - 10.5 K/uL 6.0  7.0  6.2   Hemoglobin 12.0 - 15.0 g/dL 12.6  12.6  12.3   Hematocrit 36.0 - 46.0 % 39.3  40.3  38.6   Platelets 150 - 400 K/uL 569  544  621       Latest Ref Rng & Units 08/05/2022    1:21 PM 07/12/2022   10:52 AM 06/06/2022    1:47 PM  CMP  Glucose 70 - 99 mg/dL 100  96  104   BUN 8 - 23 mg/dL _0 Creatinine 0.44 - 1.00 mg/dL 0.80  0.77  0.86   Sodium 135 - 145 mmol/L 138  138  136   Potassium 3.5 - 5.1 mmol/L 4.2  4.5  4.0   Chloride 98 - 111 mmol/L 107  110  110   CO2 22 - 32 mmol/L _1 Calcium 8.9 - 10.3 mg/dL 9.0  9.2  8.8   Total Protein 6.5 - 8.1 g/dL 7.4  7.8  7.3   Total Bilirubin 0.3 - 1.2 mg/dL 0.5  0.5  0.6   Alkaline Phos 38 - 126 U/L 49  43  48   AST 15 - 41 U/L _2 ALT 0 - 44 U/L _3 Iron/TIBC/Ferritin/ %Sat    Component Value Date/Time   IRON 62 05/16/2022 1446   TIBC 288 05/16/2022 1446   FERRITIN 76 05/16/2022 1448    IRONPCTSAT 22 05/16/2022 1446      RADIOGRAPHIC STUDIES: I have personally reviewed the radiological images as listed and agreed with the findings in the report. No results found.

## 2022-08-06 ENCOUNTER — Other Ambulatory Visit: Payer: Self-pay | Admitting: Physical Medicine & Rehabilitation

## 2022-08-06 DIAGNOSIS — M5414 Radiculopathy, thoracic region: Secondary | ICD-10-CM

## 2022-08-16 ENCOUNTER — Ambulatory Visit
Admission: RE | Admit: 2022-08-16 | Discharge: 2022-08-16 | Disposition: A | Discharge status: Home / Self Care | Payer: 59 | Source: Ambulatory Visit | Attending: Physical Medicine & Rehabilitation | Admitting: Physical Medicine & Rehabilitation

## 2022-08-16 DIAGNOSIS — M5414 Radiculopathy, thoracic region: Secondary | ICD-10-CM | POA: Insufficient documentation

## 2022-08-26 ENCOUNTER — Telehealth: Payer: Self-pay

## 2022-08-26 NOTE — Telephone Encounter (Signed)
Patient contacted office to request an EGD with Dr. Vicente Males.  She said she has had one with him in the past.  Reviewed chart but did not see an EGD report 01/18/22 Endo note is actually colonoscopy.  She stated that food is getting stuck.  I told her I would ask provider to review chart and advise on EGD.  Thanks,  [place], [place]

## 2022-08-27 ENCOUNTER — Telehealth: Payer: Self-pay | Admitting: Gastroenterology

## 2022-08-27 NOTE — Telephone Encounter (Signed)
Spoke with patient and she states that she told [facility] to cancel the appointment they have scheduled for her because she wants to see Dr Vicente Males.

## 2022-08-28 ENCOUNTER — Ambulatory Visit (INDEPENDENT_AMBULATORY_CARE_PROVIDER_SITE_OTHER): Payer: 59 | Admitting: Gastroenterology

## 2022-08-28 ENCOUNTER — Encounter: Payer: Self-pay | Admitting: Gastroenterology

## 2022-08-28 ENCOUNTER — Other Ambulatory Visit: Payer: Self-pay

## 2022-08-28 VITALS — BP 151/83 | HR 78 | Temp 97.7°F | Ht 64.0 in | Wt 159.0 lb

## 2022-08-28 DIAGNOSIS — R1319 Other dysphagia: Secondary | ICD-10-CM

## 2022-08-28 MED ORDER — FAMOTIDINE 40 MG PO TABS: 40.0000 mg | ORAL_TABLET | Freq: Every day | ORAL | 0 refills | Status: DC

## 2022-08-28 NOTE — Progress Notes (Signed)
Jonathon Bellows MD, MRCP(U.K) [address]  [address]  [place], [place] [address]  Main: [phone]  Fax: [phone]   Gastroenterology Consultation  Referring Provider:     Maryland Pink, MD Primary Care Physician:  Mary[place] Pink, MD Primary Gastroenterologist:  Dr. Jonathon Bellows  Reason for Consultation:    Dysphagia        HPI:   Robin Choi is a 61 y.o. y/o female referred for consultation & management  by Dr. Maryland Pink, MD.     She is today to see me for dysphagia.  She has previously had a colonoscopy by myself in April 2023 with no polyps seen diverticulosis noted.  Having difficult swallowing solids more than liquids for the past few months had put off any evaluation since she has been dealing with an elevated platelet count which has been determined to be secondary to essential thrombocytosis she has had a pulmonary embolism recently and has been on Eliquis for the same.  She has had situations where food got stuck and she struggled to get it out.  Never visited the ER for the same.  No significant unintentional weight loss but she has lost some weight intentionally no family history of esophageal cancer she is an ex-smoker.  06/10/2022 CT angiogram chest PE protocol showed no abnormalities.  MR thoracic spine in November 2023 showed mild S-shaped curvature of the thoracolumbar spine.  08/05/2022 hemoglobin 12.6 g with a platelet count of 569.  CMP was normal. Past Medical History:  Diagnosis Date   GERD (gastroesophageal reflux disease)    Hypothyroidism     Past Surgical History:  Procedure Laterality Date   COLONOSCOPY WITH PROPOFOL N/A 01/15/2022   Procedure: COLONOSCOPY WITH PROPOFOL;  Surgeon: Jonathon Bellows, MD;  Location: [facility] END[facility]COPY;  Service: Gastroenterology;  Laterality: N/A;  Needs an early appointment.  Has disabled brother and easier to get sitter early in the morning.   NOVASURE ABLATION     OTHER SURGICAL HISTORY     "tubal  cauterization"    Prior to Admission medications   Medication Sig Start Date End Date Taking? Authorizing Provider  almotriptan (AXERT) 12.5 MG tablet  07/31/17   [provider]  apixaban (ELIQUIS) 5 MG TABS tablet Take 1 tablet ('5mg'$ ) twice daily 06/28/22   Earlie Server, MD  ARMOUR THYROID 60 MG tablet Take 60 mg by mouth daily. 12/05/19   [provider]  cephALEXin (KEFLEX) 500 MG capsule Take 500 mg by mouth 2 (two) times daily. 08/22/22   [provider]  cetirizine (ZYRTEC) 10 MG tablet Take by mouth.    [provider]  ciprofloxacin (CIPRO) 250 MG tablet Take 250 mg by mouth 2 (two) times daily. 08/20/22   [provider]  clobetasol cream (TEMOVATE) 0.05 % Apply to affected areas hands 1-2 times a day until rash improved. Avoid face, groin, underarms. 12/13/20   Brendolyn Patty, MD  ergocalciferol (VITAMIN D2) 1.25 MG (50000 UT) capsule Take 50,000 Units by mouth once a week. 11/21/21   [provider]  hydroxyurea (HYDREA) 500 MG capsule Take 1 capsule (500 mg total) by mouth See admin instructions. May take with food to minimize GI side effects. Take 1000 mg on every Wednesday, and take '500mg'$  daily for the rest of the week. 08/05/22   Earlie Server, MD  ketoconazole (NIZORAL) 2 % cream Apply to affected areas body folds 1-2 times a day as needed until rash improved. 12/13/20   Brendolyn Patty, MD  lansoprazole (PREVACID) 15 MG capsule     [provider]  topiramate (TOPAMAX) 50 MG tablet Take 50 mg by mouth 2 (two) times daily. 01/15/22   [provider]  valACYclovir (VALTREX) 1000 MG tablet  09/11/15   [provider]  YUVAFEM 10 MCG TABS vaginal tablet Place vaginally. 05/03/22   [provider]    Family History  Problem Relation Age of Onset   Diabetes Mother    Colon cancer Mother    Breast cancer Mother    Cancer Father        carcinoma   Bone cancer Brother    Diabetes Maternal Grandmother       Social History   Tobacco Use   Smoking status: Former    Types: Cigarettes    Quit date: 1980    Years since quitting: 43.9   Smokeless tobacco: Never   Tobacco comments:    Quit 40 years ago  Vaping Use   Vaping Use: Never used  Substance Use Topics   Alcohol use: Not Currently    Comment: occational beer   Drug use: Never    Allergies as of 08/28/2022 - Review Complete 08/28/2022  Allergen Reaction Noted   Celecoxib Hives 05/14/2012   Nitrofurantoin macrocrystal Hives 01/02/2022   Nitrofurantoin Hives 05/14/2012   Sulfa antibiotics Hives 05/14/2012    Review of Systems:    All systems reviewed and negative except where noted in HPI.   Physical Exam:  BP (!) 151/83   Pulse 78   Temp 97.7 F (36.5 C) (Oral)   Ht '5\' 4"'$  (1.626 m)   Wt 159 lb (72.1 kg)   BMI 27.29 kg/m  No LMP recorded. Patient is postmenopausal. Psych:  Alert and cooperative. Normal mood and affect. General:   Alert,  Well-developed, well-nourished, pleasant and cooperative in NAD Head:  Normocephalic and atraumatic. Eyes:  Sclera clear, no icterus.   Conjunctiva pink. Ears:  Normal auditory acuity. Neck:  Supple; no masses or thyromegaly. Lungs:  Respirations even and unlabored.  Clear throughout to auscultation.   No wheezes, crackles, or rhonchi. No acute distress. Heart:  Regular rate and rhythm; no murmurs, clicks, rubs, or gallops. Abdomen:  Normal bowel sounds.  No bruits.  Soft, non-tender and non-distended without masses, hepatosplenomegaly or hernias noted.  No guarding or rebound tenderness.    Neurologic:  Alert and oriented x3;  grossly normal neurologically. Psych:  Alert and cooperative. Normal mood and affect.  Imaging Studies: MR THORACIC SPINE WO CONTRAST  Result Date: 08/18/2022 CLINICAL DATA:  Recurring flank pain for 8 months, just below ribcage EXAM: MRI THORACIC SPINE WITHOUT CONTRAST TECHNIQUE: Multiplanar, multisequence MR imaging of the thoracic spine was performed.  No intravenous contrast was administered. COMPARISON:  None Available. FINDINGS: Alignment: Mild S-shaped curvature of the thoracolumbar spine. No listhesis. Vertebrae: No acute fracture or suspicious osseous lesion. Cord:  Normal signal and morphology. Paraspinal and other soft tissues: Negative. Disc levels: Minimal disc bulges at T2-T3, T6-T7, T7-T8, T9-T10, and T12-L1, without significant spinal canal stenosis or neural foraminal narrowing. IMPRESSION: 1. No significant spinal canal stenosis or neural foraminal narrowing in the thoracic spine. 2. Mild S-shaped curvature of the thoracolumbar spine. Electronically Signed   By: Merilyn Baba M.D.   On: 08/18/2022 23:33    Assessment and Plan:   Thai Hemrick is a 61 y.o. y/o female has been referred for dysphagia.  Plan 1.EGD off Eliquis for 2 days we will get clearance to hold off  Eliquis by Dr. Tasia Catchings 2.  Commence on Pepcid 40 mg a day a day first thing in the morning empty stomach to empirically treat any reflux.  I have discussed alternative options, risks & benefits,  which include, but are not limited to, bleeding, infection, perforation,respiratory complication & drug reaction.  The patient agrees with this plan & written consent will be obtained.     Follow up in 8 weeks  Dr Jonathon Bellows MD,MRCP(U.K)

## 2022-08-30 ENCOUNTER — Telehealth: Payer: Self-pay

## 2022-08-30 NOTE — Telephone Encounter (Signed)
Patient called and left me a voicemail wanting to know if she is ok to hold her Eliquis prescribed by Dr. Tasia Catchings. I then called her to let her know that the letter was sent to Dr. Tasia Catchings and that we were still waiting for a response but as soon I receive a response, that I will let her know if it could be held or not and if it can, for how many days. Patient understood.  I reached out to Dr. Collie Siad nurse to ask about the letter and she stated that she had on her hands and give it to Dr. Tasia Catchings to sign with an answer so we could call the patient to let her know.

## 2022-08-30 NOTE — Telephone Encounter (Signed)
Called patient to let her know that I just received instructions from Dr. Tasia Catchings to hold her Eliquis 2 days prior to procedure and restart 1 day after. Patient understood and had no further questions. The form will be in patient's chart under Media.

## 2022-09-02 ENCOUNTER — Inpatient Hospital Stay: Payer: 59 | Attending: Oncology

## 2022-09-02 DIAGNOSIS — D471 Chronic myeloproliferative disease: Secondary | ICD-10-CM

## 2022-09-02 DIAGNOSIS — D75839 Thrombocytosis, unspecified: Secondary | ICD-10-CM | POA: Insufficient documentation

## 2022-09-02 LAB — CBC WITH DIFFERENTIAL/PLATELET
Abs Immature Granulocytes: 0.02 10*3/uL (ref 0.00–0.07)
Basophils Absolute: 0.1 10*3/uL (ref 0.0–0.1)
Basophils Relative: 1 %
Eosinophils Absolute: 0.2 10*3/uL (ref 0.0–0.5)
Eosinophils Relative: 3 %
HCT: 39.4 % (ref 36.0–46.0)
Hemoglobin: 12.8 g/dL (ref 12.0–15.0)
Immature Granulocytes: 0 %
Lymphocytes Relative: 35 %
Lymphs Abs: 2.1 10*3/uL (ref 0.7–4.0)
MCH: 31.1 pg (ref 26.0–34.0)
MCHC: 32.5 g/dL (ref 30.0–36.0)
MCV: 95.6 fL (ref 80.0–100.0)
Monocytes Absolute: 0.6 10*3/uL (ref 0.1–1.0)
Monocytes Relative: 10 %
Neutro Abs: 3.1 10*3/uL (ref 1.7–7.7)
Neutrophils Relative %: 51 %
Platelets: 474 10*3/uL — ABNORMAL HIGH (ref 150–400)
RBC: 4.12 MIL/uL (ref 3.87–5.11)
RDW: 18.6 % — ABNORMAL HIGH (ref 11.5–15.5)
WBC: 6 10*3/uL (ref 4.0–10.5)
nRBC: 0 % (ref 0.0–0.2)

## 2022-09-04 ENCOUNTER — Ambulatory Visit
Admission: RE | Admit: 2022-09-04 | Discharge: 2022-09-04 | Disposition: A | Discharge status: Home / Self Care | Payer: 59 | Attending: Gastroenterology | Admitting: Gastroenterology

## 2022-09-04 ENCOUNTER — Ambulatory Visit: Payer: 59 | Admitting: Certified Registered Nurse Anesthetist

## 2022-09-04 ENCOUNTER — Encounter
Admission: RE | Disposition: A | Discharge status: Home / Self Care | Payer: Self-pay | Source: Home / Self Care | Attending: Gastroenterology

## 2022-09-04 ENCOUNTER — Other Ambulatory Visit: Payer: Self-pay

## 2022-09-04 ENCOUNTER — Encounter: Payer: Self-pay | Admitting: Gastroenterology

## 2022-09-04 DIAGNOSIS — K219 Gastro-esophageal reflux disease without esophagitis: Secondary | ICD-10-CM | POA: Insufficient documentation

## 2022-09-04 DIAGNOSIS — R1310 Dysphagia, unspecified: Secondary | ICD-10-CM | POA: Diagnosis present

## 2022-09-04 DIAGNOSIS — K222 Esophageal obstruction: Secondary | ICD-10-CM | POA: Diagnosis not present

## 2022-09-04 DIAGNOSIS — F419 Anxiety disorder, unspecified: Secondary | ICD-10-CM | POA: Insufficient documentation

## 2022-09-04 DIAGNOSIS — E039 Hypothyroidism, unspecified: Secondary | ICD-10-CM | POA: Insufficient documentation

## 2022-09-04 DIAGNOSIS — R1319 Other dysphagia: Secondary | ICD-10-CM

## 2022-09-04 DIAGNOSIS — F32A Depression, unspecified: Secondary | ICD-10-CM | POA: Insufficient documentation

## 2022-09-04 HISTORY — PX: ESOPHAGOGASTRODUODENOSCOPY (EGD) WITH PROPOFOL: SHX5813

## 2022-09-04 SURGERY — ESOPHAGOGASTRODUODENOSCOPY (EGD) WITH PROPOFOL
Anesthesia: General

## 2022-09-04 MED ORDER — LIDOCAINE HCL (CARDIAC) PF 100 MG/5ML IV SOSY
PREFILLED_SYRINGE | INTRAVENOUS | Status: DC | PRN
  Administered 2022-09-04: 50 mg via INTRAVENOUS

## 2022-09-04 MED ORDER — PROPOFOL 500 MG/50ML IV EMUL
INTRAVENOUS | Status: DC | PRN
  Administered 2022-09-04: 150 ug/kg/min via INTRAVENOUS

## 2022-09-04 MED ORDER — PANTOPRAZOLE SODIUM 20 MG PO TBEC: 20.0000 mg | DELAYED_RELEASE_TABLET | Freq: Two times a day (BID) | ORAL | 0 refills | Status: DC

## 2022-09-04 MED ORDER — PROPOFOL 10 MG/ML IV BOLUS
INTRAVENOUS | Status: DC | PRN
  Administered 2022-09-04: 80 mg via INTRAVENOUS
  Administered 2022-09-04: 20 mg via INTRAVENOUS

## 2022-09-04 MED ORDER — SODIUM CHLORIDE 0.9 % IV SOLN
INTRAVENOUS | Status: DC
  Administered 2022-09-04: 100 mL via INTRAVENOUS

## 2022-09-04 NOTE — Anesthesia Postprocedure Evaluation (Signed)
Anesthesia Post Note  Patient: Robin Choi  Procedure(s) Performed: ESOPHAGOGASTRODUODENOSCOPY (EGD) WITH PROPOFOL  Patient location during evaluation: Endoscopy Anesthesia Type: General Level of consciousness: awake and alert Pain management: pain level controlled Vital Signs Assessment: post-procedure vital signs reviewed and stable Respiratory status: spontaneous breathing, nonlabored ventilation, respiratory function stable and patient connected to nasal cannula oxygen Cardiovascular status: blood pressure returned to baseline and stable Postop Assessment: no apparent nausea or vomiting Anesthetic complications: no   No notable events documented.   Last Vitals:  Vitals:   09/04/22 1310 09/04/22 1320  BP: 126/64 (!) 144/68  Pulse: (!) 49 (!) 55  Resp: 16 16  Temp:    SpO2: 99% 100%    Last Pain:  Vitals:   09/04/22 1320  TempSrc:   PainSc: 0-No pain                 Arita Miss

## 2022-09-04 NOTE — H&P (Signed)
Jonathon Bellows, MD [address] [place], [place], [place], [address] [address], [place], [place], [place], [address] Phone: [phone]  Fax: [phone]  Primary Care Physician:  Maryland Pink, MD   Pre-Procedure History & Physical: HPI:  Ramiah Helfrich is a 61 y.o. female is here for an endoscopy    Past Medical History:  Diagnosis Date   GERD (gastroesophageal reflux disease)    Hypothyroidism     Past Surgical History:  Procedure Laterality Date   COLONOSCOPY WITH PROPOFOL N/A 01/15/2022   Procedure: COLONOSCOPY WITH PROPOFOL;  Surgeon: Jonathon Bellows, MD;  Location: [facility] ENDOSCOPY;  Service: Gastroenterology;  Laterality: N/A;  Needs an early appointment.  Has disabled brother and easier to get sitter early in the morning.   NOVASURE ABLATION     OTHER SURGICAL HISTORY     "tubal cauterization"    Prior to Admission medications   Medication Sig Start Date End Date Taking? Authorizing Provider  almotriptan (AXERT) 12.5 MG tablet  07/31/17   [provider]  apixaban (ELIQUIS) 5 MG TABS tablet Take 1 tablet ('5mg'$ ) twice daily 06/28/22   Earlie Server, MD  ARMOUR THYROID 60 MG tablet Take 60 mg by mouth daily. 12/05/19   [provider]  cephALEXin (KEFLEX) 500 MG capsule Take 500 mg by mouth 2 (two) times daily. 08/22/22   [provider]  cetirizine (ZYRTEC) 10 MG tablet Take by mouth.    [provider]  ciprofloxacin (CIPRO) 250 MG tablet Take 250 mg by mouth 2 (two) times daily. 08/20/22   [provider]  clobetasol cream (TEMOVATE) 0.05 % Apply to affected areas hands 1-2 times a day until rash improved. Avoid face, groin, underarms. 12/13/20   Brendolyn Patty, MD  ergocalciferol (VITAMIN D2) 1.25 MG (50000 UT) capsule Take 50,000 Units by mouth once a week. 11/21/21   [provider]  famotidine (PEPCID) 40 MG tablet Take 1 tablet (40 mg total) by mouth daily. 08/28/22   Jonathon Bellows, MD  hydroxyurea (HYDREA) 500 MG  capsule Take 1 capsule (500 mg total) by mouth See admin instructions. May take with food to minimize GI side effects. Take 1000 mg on every Wednesday, and take '500mg'$  daily for the rest of the week. 08/05/22   Earlie Server, MD  ketoconazole (NIZORAL) 2 % cream Apply to affected areas body folds 1-2 times a day as needed until rash improved. 12/13/20   Brendolyn Patty, MD  lansoprazole (PREVACID) 15 MG capsule     [provider]  topiramate (TOPAMAX) 50 MG tablet Take 50 mg by mouth 2 (two) times daily. 01/15/22   [provider]  valACYclovir (VALTREX) 1000 MG tablet  09/11/15   [provider]  YUVAFEM 10 MCG TABS vaginal tablet Place vaginally. 05/03/22   [provider]    Allergies as of 08/28/2022 - Review Complete 08/28/2022  Allergen Reaction Noted   Celecoxib Hives 05/14/2012   Nitrofurantoin macrocrystal Hives 01/02/2022   Nitrofurantoin Hives 05/14/2012   Sulfa antibiotics Hives 05/14/2012    Family History  Problem Relation Age of Onset   Diabetes Mother    Colon cancer Mother    Breast cancer Mother    Cancer Father        carcinoma   Bone cancer Brother    Diabetes Maternal Grandmother     Social History   Socioeconomic History   Marital status: Married    Spouse name: Not on file   Number of  children: Not on file   Years of education: Not on file   Highest education level: Not on file  Occupational History   Not on file  Tobacco Use   Smoking status: Former    Types: Cigarettes    Quit date: 32    Years since quitting: 43.9   Smokeless tobacco: Never   Tobacco comments:    Quit 40 years ago  Vaping Use   Vaping Use: Never used  Substance and Sexual Activity   Alcohol use: Not Currently    Comment: occational beer   Drug use: Never   Sexual activity: Not Currently  Other Topics Concern   Not on file  Social History Narrative   Not on file   Social Determinants of Health   Financial Resource Strain: Not on file   Food Insecurity: Not on file  Transportation Needs: Not on file  Physical Activity: Not on file  Stress: Not on file  Social Connections: Not on file  Intimate Partner Violence: Not on file    Review of Systems: See HPI, otherwise negative ROS  Physical Exam: There were no vitals taken for this visit. General:   Alert,  pleasant and cooperative in NAD Head:  Normocephalic and atraumatic. Neck:  Supple; no masses or thyromegaly. Lungs:  Clear throughout to auscultation, normal respiratory effort.    Heart:  +S1, +S2, Regular rate and rhythm, No edema. Abdomen:  Soft, nontender and nondistended. Normal bowel sounds, without guarding, and without rebound.   Neurologic:  Alert and  oriented x4;  grossly normal neurologically.  Impression/Plan: Margarita Croke is here for an endoscopy  to be performed for  evaluation of dysphagia    Risks, benefits, limitations, and alternatives regarding endoscopy have been reviewed with the patient.  Questions have been answered.  All parties agreeable.   Jonathon Bellows, MD  09/04/2022, 11:54 AM

## 2022-09-04 NOTE — Transfer of Care (Signed)
Immediate Anesthesia Transfer of Care Note  Patient: Robin Choi  Procedure(s) Performed: ESOPHAGOGASTRODUODENOSCOPY (EGD) WITH PROPOFOL  Patient Location: Endoscopy Unit  Anesthesia Type:General  Level of Consciousness: awake and alert   Airway & Oxygen Therapy: Patient Spontanous Breathing  Post-op Assessment: Report given to RN and Post -op Vital signs reviewed and stable  Post vital signs: Reviewed and stable  Last Vitals:  Vitals Value Taken Time  BP    Temp    Pulse 67 09/04/22 1301  Resp 19 09/04/22 1301  SpO2 96 % 09/04/22 1301  Vitals shown include unvalidated device data.  Last Pain:  Vitals:   09/04/22 1300  TempSrc: (P) Temporal  PainSc:          Complications: No notable events documented.

## 2022-09-04 NOTE — Anesthesia Preprocedure Evaluation (Addendum)
Anesthesia Evaluation  Patient identified by MRN, date of birth, ID band Patient awake    Reviewed: Allergy & Precautions, NPO status , Patient's Chart, lab work & pertinent test results  History of Anesthesia Complications Negative for: history of anesthetic complications  Airway Mallampati: II  TM Distance: >3 FB Neck ROM: Full    Dental no notable dental hx. (+) Teeth Intact   Pulmonary neg pulmonary ROS, neg sleep apnea, neg COPD, Patient abstained from smoking.Not current smoker, former smoker   Pulmonary exam normal breath sounds clear to auscultation       Cardiovascular Exercise Tolerance: Good METS(-) hypertension(-) CAD and (-) Past MI negative cardio ROS (-) dysrhythmias  Rhythm:Regular Rate:Normal - Systolic murmurs    Neuro/Psych  Headaches PSYCHIATRIC DISORDERS Anxiety Depression       GI/Hepatic ,GERD  ,,(+)     (-) substance abuse    Endo/Other  neg diabetesHypothyroidism    Renal/GU negative Renal ROS     Musculoskeletal   Abdominal   Peds  Hematology   Anesthesia Other Findings Past Medical History: No date: GERD (gastroesophageal reflux disease) No date: Hypothyroidism  Reproductive/Obstetrics                             Anesthesia Physical Anesthesia Plan  ASA: 2  Anesthesia Plan: General   Post-op Pain Management: Minimal or no pain anticipated   Induction: Intravenous  PONV Risk Score and Plan: 3 and Propofol infusion, TIVA and Ondansetron  Airway Management Planned: Nasal Cannula  Additional Equipment: None  Intra-op Plan:   Post-operative Plan:   Informed Consent: I have reviewed the patients History and Physical, chart, labs and discussed the procedure including the risks, benefits and alternatives for the proposed anesthesia with the patient or authorized representative who has indicated his/her understanding and acceptance.     Dental  advisory given  Plan Discussed with: CRNA and Surgeon  Anesthesia Plan Comments: (Discussed risks of anesthesia with patient, including possibility of difficulty with spontaneous ventilation under anesthesia necessitating airway intervention, PONV, and rare risks such as cardiac or respiratory or neurological events, and allergic reactions. Discussed the role of CRNA in patient's perioperative care. Patient understands.)       Anesthesia Quick Evaluation

## 2022-09-04 NOTE — Op Note (Signed)
[facility] Patient Name: Robin Choi Procedure Date: 09/04/2022 12:42 PM MRN: [id number] Account #: [id number] Date of Birth: 1961-06-03 Admit Type: Outpatient Age: 61 Room: [facility] ENDO ROOM 3 Gender: Female Note Status: Finalized Instrument Name: Upper Endoscope [id number] Procedure:             Upper GI endoscopy Indications:           Dysphagia Providers:             Jonathon Bellows MD, MD Referring MD:          Jonathon Bellows MD, MD (Referring MD) Medicines:             Monitored Anesthesia Care Complications:         No immediate complications. Procedure:             Pre-Anesthesia Assessment:                        - Prior to the procedure, a History and Physical was                         performed, and patient medications, allergies and                         sensitivities were reviewed. The patient's tolerance                         of previous anesthesia was reviewed.                        - The risks and benefits of the procedure and the                         sedation options and risks were discussed with the                         patient. All questions were answered and informed                         consent was obtained.                        - ASA Grade Assessment: II - A patient with mild                         systemic disease.                        After obtaining informed consent, the endoscope was                         passed under direct vision. Throughout the procedure,                         the patient's blood pressure, pulse, and oxygen                         saturations were monitored continuously. The Endoscope                         was introduced  through the mouth, and advanced to the                         third part of duodenum. The upper GI endoscopy was                         accomplished with ease. The patient tolerated the                         procedure well. Findings:      One benign-appearing,  intrinsic mild stenosis was found at the       gastroesophageal junction. This stenosis measured 1.5 cm (inner       diameter) x less than one cm (in length). The stenosis was traversed. A       TTS dilator was passed through the scope. Dilation with a 15-16.5-18 mm       balloon dilator was performed to 18 mm. The dilation site was examined       and showed complete resolution of luminal narrowing. Biopsies were taken       with a cold forceps for histology.      The cardia and gastric fundus were normal on retroflexion. Impression:            - Benign-appearing esophageal stenosis. Dilated.                         Biopsied. Recommendation:        - Discharge patient to home (with escort).                        - Resume previous diet.                        - Use Protonix (pantoprazole) 20 mg PO BID for 8 weeks.                        - Return to my office as previously scheduled. Procedure Code(s):     --- Professional ---                        [phone], Esophagogastroduodenoscopy, flexible,                         transoral; with transendoscopic balloon dilation of                         esophagus (less than 30 mm diameter)                        43239, 59, Esophagogastroduodenoscopy, flexible,                         transoral; with biopsy, single or multiple Diagnosis Code(s):     --- Professional ---                        K22.2, Esophageal obstruction                        R13.10, Dysphagia, unspecified CPT copyright 2022 [employer]. All rights reserved. The codes documented in this  report are preliminary and upon coder review may  be revised to meet current compliance requirements. Jonathon Bellows, MD Jonathon Bellows MD, MD 09/04/2022 12:57:03 PM This report has been signed electronically. Number of Addenda: 0 Note Initiated On: 09/04/2022 12:42 PM Estimated Blood Loss:  Estimated blood loss: none.      [facility]

## 2022-09-04 NOTE — Anesthesia Procedure Notes (Signed)
Date/Time: 09/04/2022 12:46 PM  Performed by: Johnna Acosta, CRNAPre-anesthesia Checklist: Patient identified, Emergency Drugs available, Suction available, Patient being monitored and Timeout performed Patient Re-evaluated:Patient Re-evaluated prior to induction Oxygen Delivery Method: Nasal cannula Preoxygenation: Pre-oxygenation with 100% oxygen Induction Type: IV induction

## 2022-09-05 ENCOUNTER — Encounter: Payer: Self-pay | Admitting: Gastroenterology

## 2022-09-05 LAB — SURGICAL PATHOLOGY

## 2022-09-09 ENCOUNTER — Encounter: Payer: Self-pay | Admitting: Gastroenterology

## 2022-10-02 ENCOUNTER — Other Ambulatory Visit: Payer: Self-pay | Admitting: Oncology

## 2022-10-02 DIAGNOSIS — Z86711 Personal history of pulmonary embolism: Secondary | ICD-10-CM

## 2022-10-03 ENCOUNTER — Other Ambulatory Visit: Payer: Self-pay | Admitting: Oncology

## 2022-10-03 DIAGNOSIS — Z86711 Personal history of pulmonary embolism: Secondary | ICD-10-CM

## 2022-10-07 ENCOUNTER — Encounter: Payer: Self-pay | Admitting: Oncology

## 2022-10-07 ENCOUNTER — Inpatient Hospital Stay (HOSPITAL_BASED_OUTPATIENT_CLINIC_OR_DEPARTMENT_OTHER): Payer: 59 | Admitting: Oncology

## 2022-10-07 ENCOUNTER — Inpatient Hospital Stay: Payer: 59 | Attending: Oncology

## 2022-10-07 VITALS — BP 134/69 | HR 68 | Temp 97.7°F | Wt 162.7 lb

## 2022-10-07 DIAGNOSIS — Z808 Family history of malignant neoplasm of other organs or systems: Secondary | ICD-10-CM | POA: Insufficient documentation

## 2022-10-07 DIAGNOSIS — Z800 Family history of malignant neoplasm of digestive organs: Secondary | ICD-10-CM | POA: Insufficient documentation

## 2022-10-07 DIAGNOSIS — Z86711 Personal history of pulmonary embolism: Secondary | ICD-10-CM | POA: Diagnosis not present

## 2022-10-07 DIAGNOSIS — Z87891 Personal history of nicotine dependence: Secondary | ICD-10-CM | POA: Insufficient documentation

## 2022-10-07 DIAGNOSIS — M25551 Pain in right hip: Secondary | ICD-10-CM | POA: Insufficient documentation

## 2022-10-07 DIAGNOSIS — M25552 Pain in left hip: Secondary | ICD-10-CM

## 2022-10-07 DIAGNOSIS — D473 Essential (hemorrhagic) thrombocythemia: Secondary | ICD-10-CM

## 2022-10-07 DIAGNOSIS — Z7901 Long term (current) use of anticoagulants: Secondary | ICD-10-CM | POA: Insufficient documentation

## 2022-10-07 DIAGNOSIS — Z803 Family history of malignant neoplasm of breast: Secondary | ICD-10-CM | POA: Diagnosis not present

## 2022-10-07 DIAGNOSIS — R300 Dysuria: Secondary | ICD-10-CM | POA: Diagnosis not present

## 2022-10-07 DIAGNOSIS — D471 Chronic myeloproliferative disease: Secondary | ICD-10-CM

## 2022-10-07 DIAGNOSIS — Z79899 Other long term (current) drug therapy: Secondary | ICD-10-CM | POA: Diagnosis not present

## 2022-10-07 LAB — CBC WITH DIFFERENTIAL/PLATELET
Abs Immature Granulocytes: 0.01 10*3/uL (ref 0.00–0.07)
Basophils Absolute: 0.1 10*3/uL (ref 0.0–0.1)
Basophils Relative: 1 %
Eosinophils Absolute: 0.3 10*3/uL (ref 0.0–0.5)
Eosinophils Relative: 5 %
HCT: 37.6 % (ref 36.0–46.0)
Hemoglobin: 12.1 g/dL (ref 12.0–15.0)
Immature Granulocytes: 0 %
Lymphocytes Relative: 36 %
Lymphs Abs: 1.9 10*3/uL (ref 0.7–4.0)
MCH: 33.3 pg (ref 26.0–34.0)
MCHC: 32.2 g/dL (ref 30.0–36.0)
MCV: 103.6 fL — ABNORMAL HIGH (ref 80.0–100.0)
Monocytes Absolute: 0.4 10*3/uL (ref 0.1–1.0)
Monocytes Relative: 7 %
Neutro Abs: 2.6 10*3/uL (ref 1.7–7.7)
Neutrophils Relative %: 51 %
Platelets: 404 10*3/uL — ABNORMAL HIGH (ref 150–400)
RBC: 3.63 MIL/uL — ABNORMAL LOW (ref 3.87–5.11)
RDW: 16.3 % — ABNORMAL HIGH (ref 11.5–15.5)
WBC: 5.1 10*3/uL (ref 4.0–10.5)
nRBC: 0 % (ref 0.0–0.2)

## 2022-10-07 LAB — COMPREHENSIVE METABOLIC PANEL
ALT: 19 U/L (ref 0–44)
AST: 21 U/L (ref 15–41)
Albumin: 4.2 g/dL (ref 3.5–5.0)
Alkaline Phosphatase: 46 U/L (ref 38–126)
Anion gap: 8 (ref 5–15)
BUN: 18 mg/dL (ref 8–23)
CO2: 23 mmol/L (ref 22–32)
Calcium: 9 mg/dL (ref 8.9–10.3)
Chloride: 106 mmol/L (ref 98–111)
Creatinine, Ser: 1.02 mg/dL — ABNORMAL HIGH (ref 0.44–1.00)
GFR, Estimated: >60 mL/min (ref >60)
Glucose, Bld: 108 mg/dL — ABNORMAL HIGH (ref 70–99)
Potassium: 4.6 mmol/L (ref 3.5–5.1)
Sodium: 137 mmol/L (ref 135–145)
Total Bilirubin: 0.3 mg/dL (ref 0.3–1.2)
Total Protein: 7.2 g/dL (ref 6.5–8.1)

## 2022-10-07 MED ORDER — HYDROXYUREA 500 MG PO CAPS: 500.0000 mg | ORAL_CAPSULE | ORAL | 0 refills | Status: DC

## 2022-10-07 MED ORDER — APIXABAN 5 MG PO TABS: ORAL_TABLET | ORAL | 2 refills | Status: DC

## 2022-10-07 NOTE — Assessment & Plan Note (Signed)
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 Labs are reviewed and discussed with patient. Platelet counts in low 400s.  Continue Hydroxyurea to 1000 mg x 1 day per week and hydroxyurea '500mg'$  daily for the rest of the week.

## 2022-10-07 NOTE — Assessment & Plan Note (Signed)
Unprovoked, negative factor V Leiden mutation.  Negative prothrombin gene mutation Negative cardiolipin antibody, negative beta glycoprotein antibodies Continue Eliquis 5 mg twice daily, refills sent.  Discussed about the plan of decreasing to Eliquis 2.'5mg'$  BID once her platelet counts are better controlled.

## 2022-10-07 NOTE — Assessment & Plan Note (Signed)
?  Degenerative disease, vs hydroxyurea side effects -rare.  Recommend Xray

## 2022-10-07 NOTE — Progress Notes (Signed)
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 Labs are reviewed and discussed with patient. Platelet counts in low 400s.  Continue Hydroxyurea to 1000 mg x 1 day per week and hydroxyurea '500mg'$  daily for the rest of the week.   History of pulmonary embolism Unprovoked, negative factor V Leiden mutation.  Negative prothrombin gene mutation Negative cardiolipin antibody, negative beta glycoprotein antibodies Continue Eliquis 5 mg twice daily, refills sent.  Discussed about the plan of decreasing to Eliquis 2.'5mg'$  BID once her platelet counts are better controlled.   Hip pain, bilateral ? Degenerative disease, vs hydroxyurea side effects -rare.  Recommend Xray    Orders Placed This Encounter  Procedures   DG HIPS BILAT WITH PELVIS 3-4 VIEWS    Standing Status:   Future    Standing Expiration Date:   10/08/2023    Order Specific Question:   Reason for Exam (SYMPTOM  OR DIAGNOSIS REQUIRED)    Answer:   Hip pain    Order Specific Question:   Preferred imaging location?    Answer:   [facility]   CBC with Differential/Platelet    Standing Status:   Future    Standing Expiration Date:   10/07/2023   Comprehensive metabolic panel    Standing Status:   Future    Standing Expiration Date:   10/07/2023   Follow-up  3 months lab MD cbc cmp   All questions were answered. The patient knows to call the clinic with any problems, questions or concerns.  Earlie Server, MD, PhD [facility] Hematology Oncology 10/07/2022      CHIEF COMPLAINTS/REASON FOR VISIT:  thrombocytosis  HISTORY OF PRESENTING ILLNESS:   Robin Choi is a  62 y.o.  female with PMH listed below was seen in consultation at the request of  Maryland Pink, MD  for evaluation of thrombocytosis  For the past few months, patient reports lower abdominal pressure, dysuria symptoms.  She feels like "sitting on a ball"  patient has had evaluation by PCP and gynecology.  Patient reports frequent history of UTIs in the past. Patient had negative Pap smear, wet prep and ultrasound done by Dr. Chrystine Oiler. Dr. Leafy Ro reviewed the ultrasound results and images with patient.  No abnormal findings.  Her bimanual examination revealed cervical tenderness, uterine tenderness and minimal obturator tenderness and +bilateral pudendal nerves were tender.  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  12/11/2021, CBC showed a platelet count of.  804,000. 12/17/2021, platelet count 795,000 12/27/2021, platelet count 872,000.  Patient reports having life stressors.  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  She has to lift him multiple times in order to provide care.  She lives with her husband and brother-in-law.  Husband is on disability and not able to lift patient  Family history positive for father with carcinoma discovered in his neck, details unknown, breast cancer, colon cancer.She is overdue for colonoscopy.  She feels she could not get anyone to take care of brother-in-law during weekdays.   Oncology History  Essential  thrombocythemia ([employer])  01/22/2022 Imaging   CT angio chest pulmonary embolism with or without contrast showed Small volume nonocclusive pulmonary embolism in a posterior basilar left lower lobe pulmonary artery branch.     01/22/2022 Imaging   Bilateral lower extremity ultrasound negative for DVT.    02/04/2022 Imaging   CT abdomen pelvis with contrast negative, no acute findings  or other significant abnormality.   03/21/2022 Imaging   MRI abdomen with and without contrast showed no acute process or explanation for left upper quadrant pain.  Aortic atherosclerosis.   06/10/2022 Imaging   CT chest angiogram PE protocol treatment no definite evidence of pulmonary embolism.  No definite abnormality seen in the chest.   06/14/2022 Initial Diagnosis   Essential thrombocythemia -  Peripheral blood JAK2 V617F mutation negative, with reflex to other mutations CALR, MPL, JAK 2 Ex 12-15 mutations negative.  On[facility] advanced NGS JAK2, MPL, CALR reflext to myeloid panel, negative,    bone marrow biopsy aspirate showed -  No immunophenotypic evidence of a lymphoproliferative disorder (no  monoclonal B cells or immunophenotypically abnormal T cells detected).  -  1% CD34 positive blasts  Bone marrow aspirate: Clot core -  Overall normocellular bone marrow with megakaryocytic hyperplasia with hyperlobulated/atypical forms with clustering  Morphologically there is a megakaryocytic hyperplasia with  hyper lobulation/hypersegmentation to the megakaryocytes in the absence  of a prominent panmyelosis.  Peripheral blood testing was negative for  the common 3 mutations seen in essential thrombocytosis and primary  myelofibrosis (JAK2, MPL and CALR).  While this could still be secondary to an underlying reactive cause the overall morphology favors a primary myeloproliferative neoplasm such as essential thrombocytosis or less likely prefibrotic primary myelofibrosis.    +chronic left upper quadrant pain.  She has had CT scan and MRI which did not explain her pain.  INTERVAL HISTORY Robin Choi is a 62 y.o. female who has above history reviewed by me today presents for follow up visit for essential thrombocythemia.  Patient was here by herself.  She take hydroxyurea '1000mg'$  x 1 day per week and '500mg'$  daily for rest of the week Patient takes Eliquis 5 mg twice daily.   Denies any  bleeding events. She continues to have bilateral hip pain, worse when she bends down.    Review of Systems  Constitutional:  Negative for appetite change, chills, fatigue and fever.  HENT:   Negative for hearing loss and voice change.   Eyes:  Negative for eye problems.  Respiratory:  Negative for chest tightness and cough.   Cardiovascular:  Negative for chest pain.  Gastrointestinal:  Negative for abdominal distention and blood in stool.       Abdomen/pelvic pain,   Endocrine: Negative for hot flashes.  Genitourinary:  Negative for difficulty urinating and frequency.   Musculoskeletal:  Negative for arthralgias.  Skin:  Negative for itching and rash.  Neurological:  Negative for dizziness, extremity weakness and headaches.  Hematological:  Negative for adenopathy.  Psychiatric/Behavioral:  Negative for confusion.     MEDICAL HISTORY:  Past Medical History:  Diagnosis Date   GERD (gastroesophageal reflux disease)    Hypothyroidism     SURGICAL HISTORY: Past Surgical History:  Procedure Laterality Date   CARDIAC CATHETERIZATION     COLONOSCOPY WITH PROPOFOL N/A 01/15/2022   Procedure: COLONOSCOPY WITH PROPOFOL;  Surgeon: Jonathon Bellows, MD;  Location: [facility] ENDOSCOPY;  Service: Gastroenterology;  Laterality: N/A;  Needs an early appointment.  Has disabled brother and easier to get  sitter early in the morning.   ESOPHAGOGASTRODUODENOSCOPY (EGD) WITH PROPOFOL N/A 09/04/2022   Procedure: ESOPHAGOGASTRODUODENOSCOPY (EGD) WITH PROPOFOL;  Surgeon: Jonathon Bellows, MD;  Location: [facility] ENDOSCOPY;  Service: Gastroenterology;  Laterality: N/A;   NOVASURE ABLATION     OTHER SURGICAL HISTORY     "tubal cauterization"    SOCIAL HISTORY: Social History   Socioeconomic History   Marital status: Married    Spouse name: Not on file   Number of children: Not on file   Years of education: Not on file   Highest education level: Not on file  Occupational History   Not on file  Tobacco Use    Smoking status: Former    Types: Cigarettes    Quit date: 1980    Years since quitting: 44.0   Smokeless tobacco: Never   Tobacco comments:    Quit 40 years ago  Electronics engineer Use   Vaping Use: Never used  Substance and Sexual Activity   Alcohol use: Not Currently    Comment: occational beer   Drug use: Never   Sexual activity: Not Currently  Other Topics Concern   Not on file  Social History Narrative   Not on file   Social Determinants of Health   Financial Resource Strain: Not on file  Food Insecurity: Not on file  Transportation Needs: Not on file  Physical Activity: Not on file  Stress: Not on file  Social Connections: Not on file  Intimate Partner Violence: Not on file    FAMILY HISTORY: Family History  Problem Relation Age of Onset   Diabetes Mother    Colon cancer Mother    Breast cancer Mother    Cancer Father        carcinoma   Bone cancer Brother    Diabetes Maternal Grandmother     ALLERGIES:  is allergic to celecoxib, nitrofurantoin macrocrystal, nitrofurantoin, and sulfa antibiotics.  MEDICATIONS:  Current Outpatient Medications  Medication Sig Dispense Refill   ARMOUR THYROID 60 MG tablet Take 60 mg by mouth daily.     cetirizine (ZYRTEC) 10 MG tablet Take by mouth.     clobetasol cream (TEMOVATE) 0.05 % Apply to affected areas hands 1-2 times a day until rash improved. Avoid face, groin, underarms. 30 g 1   ketoconazole (NIZORAL) 2 % cream Apply to affected areas body folds 1-2 times a day as needed until rash improved. 60 g 2   pantoprazole (PROTONIX) 20 MG tablet Take 1 tablet (20 mg total) by mouth 2 (two) times daily. 180 tablet 0   topiramate (TOPAMAX) 50 MG tablet Take 50 mg by mouth 2 (two) times daily.     YUVAFEM 10 MCG TABS vaginal tablet Place vaginally.     almotriptan (AXERT) 12.5 MG tablet  (Patient not taking: Reported on 08/28/2022)     apixaban (ELIQUIS) 5 MG TABS tablet Take 1 tablet ('5mg'$ ) twice daily 60 tablet 2   ergocalciferol  (VITAMIN D2) 1.25 MG (50000 UT) capsule Take 50,000 Units by mouth once a week. (Patient not taking: Reported on 10/07/2022)     famotidine (PEPCID) 40 MG tablet Take 1 tablet (40 mg total) by mouth daily. (Patient not taking: Reported on 10/07/2022) 90 tablet 0   hydroxyurea (HYDREA) 500 MG capsule Take 1 capsule (500 mg total) by mouth See admin instructions. May take with food to minimize GI side effects. Take 1000 mg on every Wednesday, and take '500mg'$  daily for the rest of the week. 90 capsule 0   lansoprazole (  PREVACID) 15 MG capsule  (Patient not taking: Reported on 08/28/2022)     valACYclovir (VALTREX) 1000 MG tablet  (Patient not taking: Reported on 08/28/2022)     No current facility-administered medications for this visit.     PHYSICAL EXAMINATION: ECOG PERFORMANCE STATUS: 1 - Symptomatic but completely ambulatory Vitals:   10/07/22 1424  BP: 134/69  Pulse: 68  Temp: 97.7 F (36.5 C)  SpO2: 100%   Filed Weights   10/07/22 1424  Weight: 162 lb 11.2 oz (73.8 kg)    Physical Exam Constitutional:      General: She is not in acute distress. HENT:     Head: Normocephalic and atraumatic.  Eyes:     General: No scleral icterus. Cardiovascular:     Rate and Rhythm: Normal rate.  Pulmonary:     Effort: Pulmonary effort is normal. No respiratory distress.  Abdominal:     General: There is no distension.  Musculoskeletal:        General: No deformity. Normal range of motion.     Cervical back: Normal range of motion and neck supple.  Skin:    General: Skin is warm and dry.     Findings: No erythema or rash.  Neurological:     Mental Status: She is alert and oriented to person, place, and time. Mental status is at baseline.  Psychiatric:        Mood and Affect: Mood normal.     LABORATORY DATA:  I have reviewed the data as listed    Latest Ref Rng & Units 10/07/2022    2:04 PM 09/02/2022   10:42 AM 08/05/2022    1:21 PM  CBC  WBC 4.0 - 10.5 K/uL 5.1  6.0  6.0    Hemoglobin 12.0 - 15.0 g/dL 12.1  12.8  12.6   Hematocrit 36.0 - 46.0 % 37.6  39.4  39.3   Platelets 150 - 400 K/uL 404  474  569       Latest Ref Rng & Units 10/07/2022    2:04 PM 08/05/2022    1:21 PM 07/12/2022   10:52 AM  CMP  Glucose 70 - 99 mg/dL 108  100  96   BUN 8 - 23 mg/dL '18  12  15   '$ Creatinine 0.44 - 1.00 mg/dL 1.02  0.80  0.77   Sodium 135 - 145 mmol/L 137  138  138   Potassium 3.5 - 5.1 mmol/L 4.6  4.2  4.5   Chloride 98 - 111 mmol/L 106  107  110   CO2 22 - 32 mmol/L '23  24  24   '$ Calcium 8.9 - 10.3 mg/dL 9.0  9.0  9.2   Total Protein 6.5 - 8.1 g/dL 7.2  7.4  7.8   Total Bilirubin 0.3 - 1.2 mg/dL 0.3  0.5  0.5   Alkaline Phos 38 - 126 U/L 46  49  43   AST 15 - 41 U/L '21  18  21   '$ ALT 0 - 44 U/L '19  21  22     '$ Iron/TIBC/Ferritin/ %Sat    Component Value Date/Time   IRON 62 05/16/2022 1446   TIBC 288 05/16/2022 1446   FERRITIN 76 05/16/2022 1448   IRONPCTSAT 22 05/16/2022 1446      RADIOGRAPHIC STUDIES: I have personally reviewed the radiological images as listed and agreed with the findings in the report. No results found.

## 2022-10-08 ENCOUNTER — Other Ambulatory Visit: Payer: Self-pay

## 2022-10-08 ENCOUNTER — Ambulatory Visit
Admission: RE | Admit: 2022-10-08 | Discharge: 2022-10-08 | Disposition: A | Discharge status: Home / Self Care | Payer: 59 | Attending: Oncology | Admitting: Oncology

## 2022-10-08 ENCOUNTER — Ambulatory Visit
Admission: RE | Admit: 2022-10-08 | Discharge: 2022-10-08 | Disposition: A | Discharge status: Home / Self Care | Payer: 59 | Source: Ambulatory Visit | Attending: Oncology | Admitting: Oncology

## 2022-10-08 DIAGNOSIS — M25552 Pain in left hip: Secondary | ICD-10-CM | POA: Insufficient documentation

## 2022-10-08 DIAGNOSIS — M160 Bilateral primary osteoarthritis of hip: Secondary | ICD-10-CM | POA: Insufficient documentation

## 2022-10-08 DIAGNOSIS — M25551 Pain in right hip: Secondary | ICD-10-CM | POA: Diagnosis present

## 2022-10-15 ENCOUNTER — Ambulatory Visit (INDEPENDENT_AMBULATORY_CARE_PROVIDER_SITE_OTHER): Payer: 59 | Admitting: Gastroenterology

## 2022-10-15 ENCOUNTER — Encounter: Payer: Self-pay | Admitting: Gastroenterology

## 2022-10-15 VITALS — BP 144/81 | HR 71 | Temp 98.1°F | Wt 160.0 lb

## 2022-10-15 DIAGNOSIS — R1319 Other dysphagia: Secondary | ICD-10-CM | POA: Diagnosis not present

## 2022-10-15 MED ORDER — OMEPRAZOLE 20 MG PO CPDR: 20.0000 mg | DELAYED_RELEASE_CAPSULE | Freq: Two times a day (BID) | ORAL | 11 refills | Status: DC

## 2022-10-15 NOTE — Addendum Note (Signed)
Addended by: Wayna Chalet on: 10/15/2022 01:22 PM   Modules accepted: Orders

## 2022-10-15 NOTE — Progress Notes (Signed)
Jonathon Bellows MD, MRCP(U.K) [address]  [place]  [place], [place] [address]  Main: [phone]  Fax: [phone]   Primary Care Physician: Maryland Pink, MD  Primary Gastroenterologist:  Dr. Jonathon Bellows   Chief Complaint  Patient presents with   Gastroesophageal Reflux    HPI: Robin Choi is a 62 y.o. female   Summary of history :  She was initially seen on 08/28/2022 for dysphagia she was having difficulty swallowing solids more than liquids.  She has previously had a colonoscopy by myself in April 2023 with no polyps seen diverticulosis noted.  She has had situations where food got stuck and she struggled to get it out.  Never visited the ER for the same.  No significant unintentional weight loss but she has lost some weight intentionally no family history of esophageal cancer she is an ex-smoker.   06/10/2022 CT angiogram chest PE protocol showed no abnormalities.  MR thoracic spine in November 2023 showed mild S-shaped curvature of the thoracolumbar spine.   08/05/2022 hemoglobin 12.6 g with a platelet count of 569.  CMP was normal.  Interval history   08/28/2022-10/15/2022    09/04/2022: EGD:  Showed a benign stenosis that was dilated to 18 mm .  Since the dilation she is doing very well.  No issues swallowing solids or liquids.  She is presently taking a PPI. Current Outpatient Medications  Medication Sig Dispense Refill   almotriptan (AXERT) 12.5 MG tablet      apixaban (ELIQUIS) 5 MG TABS tablet Take 1 tablet ('5mg'$ ) twice daily 60 tablet 2   ARMOUR THYROID 60 MG tablet Take 60 mg by mouth daily.     cetirizine (ZYRTEC) 10 MG tablet Take by mouth.     clobetasol cream (TEMOVATE) 0.05 % Apply to affected areas hands 1-2 times a day until rash improved. Avoid face, groin, underarms. 30 g 1   ergocalciferol (VITAMIN D2) 1.25 MG (50000 UT) capsule Take 50,000 Units by mouth once a week.     famotidine (PEPCID) 40 MG tablet Take 1 tablet (40 mg total) by  mouth daily. 90 tablet 0   hydroxyurea (HYDREA) 500 MG capsule Take 1 capsule (500 mg total) by mouth See admin instructions. May take with food to minimize GI side effects. Take 1000 mg on every Wednesday, and take '500mg'$  daily for the rest of the week. 90 capsule 0   ketoconazole (NIZORAL) 2 % cream Apply to affected areas body folds 1-2 times a day as needed until rash improved. 60 g 2   pantoprazole (PROTONIX) 20 MG tablet Take 1 tablet (20 mg total) by mouth 2 (two) times daily. 180 tablet 0   topiramate (TOPAMAX) 50 MG tablet Take 50 mg by mouth 2 (two) times daily.     valACYclovir (VALTREX) 1000 MG tablet      YUVAFEM 10 MCG TABS vaginal tablet Place vaginally.     No current facility-administered medications for this visit.    Allergies as of 10/15/2022 - Review Complete 10/15/2022  Allergen Reaction Noted   Celecoxib Hives 05/14/2012   Nitrofurantoin macrocrystal Hives 01/02/2022   Nitrofurantoin Hives 05/14/2012   Sulfa antibiotics Hives 05/14/2012    ROS:  General: Negative for anorexia, weight loss, fever, chills, fatigue, weakness. ENT: Negative for hoarseness, difficulty swallowing , nasal congestion. CV: Negative for chest pain, angina, palpitations, dyspnea on exertion, peripheral edema.  Respiratory: Negative for dyspnea at rest, dyspnea on exertion, cough, sputum, wheezing.  GI: See history of  present illness. GU:  Negative for dysuria, hematuria, urinary incontinence, urinary frequency, nocturnal urination.  Endo: Negative for unusual weight change.    Physical Examination:   BP (!) 144/81   Pulse 71   Temp 98.1 F (36.7 C) (Oral)   Wt 160 lb (72.6 kg)   BMI 28.34 kg/m   General: Well-nourished, well-developed in no acute distress.  Eyes: No icterus. Conjunctivae pink. Neuro: Alert and oriented x 3.  Grossly intact. Skin: Warm and dry, no jaundice.   Psych: Alert and cooperative, normal mood and affect.   Imaging Studies: DG HIP UNILAT W OR W/O PELVIS  2-3 VIEWS LEFT  Result Date: 10/08/2022 CLINICAL DATA:  Pain and lateral aspect of the bilateral hips for 6 months. EXAM: DG HIP (WITH OR WITHOUT PELVIS) 2-3V LEFT; DG HIP (WITH OR WITHOUT PELVIS) 2-3V RIGHT COMPARISON:  CT abdomen and pelvis 02/01/2022 FINDINGS: The bilateral sacroiliac joint spaces are maintained. Mild superior pubic symphysis and minimal bilateral superior femoroacetabular joint space narrowing. No acute fracture or dislocation. Vascular phleboliths overlie the pelvis. IMPRESSION: Minimal bilateral femoroacetabular osteoarthritis. Electronically Signed   By: Yvonne Kendall M.D.   On: 10/08/2022 17:35   DG HIP UNILAT W OR W/O PELVIS 2-3 VIEWS RIGHT  Result Date: 10/08/2022 CLINICAL DATA:  Pain and lateral aspect of the bilateral hips for 6 months. EXAM: DG HIP (WITH OR WITHOUT PELVIS) 2-3V LEFT; DG HIP (WITH OR WITHOUT PELVIS) 2-3V RIGHT COMPARISON:  CT abdomen and pelvis 02/01/2022 FINDINGS: The bilateral sacroiliac joint spaces are maintained. Mild superior pubic symphysis and minimal bilateral superior femoroacetabular joint space narrowing. No acute fracture or dislocation. Vascular phleboliths overlie the pelvis. IMPRESSION: Minimal bilateral femoroacetabular osteoarthritis. Electronically Signed   By: Yvonne Kendall M.D.   On: 10/08/2022 17:35    Assessment and Plan:   Robin Choi is a 62 y.o. y/o female here to follow up for dysphagia.  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   Plan 1.stop PPI 2.  Commence on Pepcid 20 mg twice daily.  We discussed about lifestyle changes for acid reflux and if she were to have symptoms of reflux to call our office and we can start her on a PPI at the lowest dose that is needed. 3.  I have stretched her esophagus to 18 mm and she is doing well hopefully should give her relief for a few years if she were to develop dysphagia she can give [place] a call back for repeat EGD and dilation in a few years   Dr Jonathon Bellows  MD,MRCP  [facility] Follow up in as needed

## 2022-10-29 ENCOUNTER — Telehealth: Payer: Self-pay | Admitting: Gastroenterology

## 2022-10-29 NOTE — Telephone Encounter (Signed)
Patient calling stating that the omeprazole is not working but the pantoprazole did work. Would like to switch.

## 2022-10-30 MED ORDER — PANTOPRAZOLE SODIUM 20 MG PO TBEC: 20.0000 mg | DELAYED_RELEASE_TABLET | Freq: Two times a day (BID) | ORAL | 0 refills | Status: DC

## 2022-10-30 NOTE — Telephone Encounter (Signed)
Patient's prescription was switched to Pantoprazole 20 MG twice a day per Dr. Vicente Males.

## 2022-10-30 NOTE — Addendum Note (Signed)
Addended by: Wayna Chalet on: 10/30/2022 11:09 AM   Modules accepted: Orders

## 2022-10-30 NOTE — Telephone Encounter (Signed)
Dr. Vicente Males, can you please let me know if that's okay with you.

## 2022-12-26 ENCOUNTER — Telehealth: Payer: Self-pay | Admitting: *Deleted

## 2022-12-26 DIAGNOSIS — Z86711 Personal history of pulmonary embolism: Secondary | ICD-10-CM

## 2022-12-26 NOTE — Telephone Encounter (Signed)
Patient called requesting that we add a Mg+ lab draw to her labs on 4/17. Please advise

## 2023-01-08 ENCOUNTER — Encounter: Payer: Self-pay | Admitting: Oncology

## 2023-01-08 ENCOUNTER — Inpatient Hospital Stay (HOSPITAL_BASED_OUTPATIENT_CLINIC_OR_DEPARTMENT_OTHER): Payer: 59 | Admitting: Oncology

## 2023-01-08 ENCOUNTER — Inpatient Hospital Stay: Payer: 59 | Attending: Oncology

## 2023-01-08 VITALS — BP 129/63 | HR 67 | Temp 97.5°F | Resp 18 | Wt 160.2 lb

## 2023-01-08 DIAGNOSIS — R1012 Left upper quadrant pain: Secondary | ICD-10-CM | POA: Diagnosis not present

## 2023-01-08 DIAGNOSIS — R5383 Other fatigue: Secondary | ICD-10-CM | POA: Insufficient documentation

## 2023-01-08 DIAGNOSIS — E876 Hypokalemia: Secondary | ICD-10-CM | POA: Insufficient documentation

## 2023-01-08 DIAGNOSIS — Z87440 Personal history of urinary (tract) infections: Secondary | ICD-10-CM | POA: Diagnosis not present

## 2023-01-08 DIAGNOSIS — D473 Essential (hemorrhagic) thrombocythemia: Secondary | ICD-10-CM | POA: Insufficient documentation

## 2023-01-08 DIAGNOSIS — Z87891 Personal history of nicotine dependence: Secondary | ICD-10-CM | POA: Insufficient documentation

## 2023-01-08 DIAGNOSIS — Z7901 Long term (current) use of anticoagulants: Secondary | ICD-10-CM | POA: Diagnosis not present

## 2023-01-08 DIAGNOSIS — I2699 Other pulmonary embolism without acute cor pulmonale: Secondary | ICD-10-CM | POA: Insufficient documentation

## 2023-01-08 DIAGNOSIS — Z803 Family history of malignant neoplasm of breast: Secondary | ICD-10-CM | POA: Insufficient documentation

## 2023-01-08 DIAGNOSIS — Z800 Family history of malignant neoplasm of digestive organs: Secondary | ICD-10-CM | POA: Diagnosis not present

## 2023-01-08 DIAGNOSIS — Z86711 Personal history of pulmonary embolism: Secondary | ICD-10-CM

## 2023-01-08 LAB — CBC WITH DIFFERENTIAL/PLATELET
Abs Immature Granulocytes: 0.01 10*3/uL (ref 0.00–0.07)
Basophils Absolute: 0.1 10*3/uL (ref 0.0–0.1)
Basophils Relative: 2 %
Eosinophils Absolute: 0.2 10*3/uL (ref 0.0–0.5)
Eosinophils Relative: 6 %
HCT: 36.4 % (ref 36.0–46.0)
Hemoglobin: 12.1 g/dL (ref 12.0–15.0)
Immature Granulocytes: 0 %
Lymphocytes Relative: 41 %
Lymphs Abs: 1.6 10*3/uL (ref 0.7–4.0)
MCH: 34.7 pg — ABNORMAL HIGH (ref 26.0–34.0)
MCHC: 33.2 g/dL (ref 30.0–36.0)
MCV: 104.3 fL — ABNORMAL HIGH (ref 80.0–100.0)
Monocytes Absolute: 0.3 10*3/uL (ref 0.1–1.0)
Monocytes Relative: 8 %
Neutro Abs: 1.7 10*3/uL (ref 1.7–7.7)
Neutrophils Relative %: 43 %
Platelets: 337 10*3/uL (ref 150–400)
RBC: 3.49 MIL/uL — ABNORMAL LOW (ref 3.87–5.11)
RDW: 13.1 % (ref 11.5–15.5)
WBC: 3.9 10*3/uL — ABNORMAL LOW (ref 4.0–10.5)
nRBC: 0 % (ref 0.0–0.2)

## 2023-01-08 LAB — COMPREHENSIVE METABOLIC PANEL
ALT: 19 U/L (ref 0–44)
AST: 19 U/L (ref 15–41)
Albumin: 4.2 g/dL (ref 3.5–5.0)
Alkaline Phosphatase: 42 U/L (ref 38–126)
Anion gap: 6 (ref 5–15)
BUN: 18 mg/dL (ref 8–23)
CO2: 23 mmol/L (ref 22–32)
Calcium: 8.8 mg/dL — ABNORMAL LOW (ref 8.9–10.3)
Chloride: 109 mmol/L (ref 98–111)
Creatinine, Ser: 0.93 mg/dL (ref 0.44–1.00)
GFR, Estimated: >60 mL/min (ref >60)
Glucose, Bld: 107 mg/dL — ABNORMAL HIGH (ref 70–99)
Potassium: 3.3 mmol/L — ABNORMAL LOW (ref 3.5–5.1)
Sodium: 138 mmol/L (ref 135–145)
Total Bilirubin: 0.5 mg/dL (ref 0.3–1.2)
Total Protein: 7.2 g/dL (ref 6.5–8.1)

## 2023-01-08 LAB — MAGNESIUM: Magnesium: 2.3 mg/dL (ref 1.7–2.4)

## 2023-01-08 MED ORDER — POTASSIUM CHLORIDE CRYS ER 20 MEQ PO TBCR: 20.0000 meq | EXTENDED_RELEASE_TABLET | Freq: Every day | ORAL | 0 refills | Status: DC

## 2023-01-08 MED ORDER — APIXABAN 2.5 MG PO TABS: 2.5000 mg | ORAL_TABLET | Freq: Two times a day (BID) | ORAL | 1 refills | Status: DC

## 2023-01-08 MED ORDER — HYDROXYUREA 500 MG PO CAPS: 500.0000 mg | ORAL_CAPSULE | ORAL | 0 refills | Status: DC

## 2023-01-08 NOTE — Progress Notes (Addendum)
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 Labs are reviewed and discussed with patient. Platelet counts within normal limits. .  Continue Hydroxyurea to 1000 mg x 1 day per week and hydroxyurea  daily for the rest of the week.   Pulmonary embolism Recommend finish currently supply of Eliquis 5 mg twice daily, then start Elqiuis 2.5mg  BID  Fatigue She has no anemia.  Fatigue may be due to other etiologies. She appears to have underlying mood disorders, ie Anxiety. Patient declines psychiatry referral. Will check TSH, B12, at next visit.   Hypokalemia Recommnd KCL daily x 3. Rx sent.    Orders Placed This Encounter  Procedures   CBC with Differential (Cancer Center Only)    Standing Status:   Future    Standing Expiration Date:   01/08/2024   CMP (Cancer Center only)    Standing Status:   Future    Standing Expiration Date:   01/08/2024   Follow-up  3 months lab MD cbc cmp   All questions were answered. The patient knows to call the clinic with any problems, questions or concerns.  Rickard Patience, MD, PhD [facility] Hematology Oncology 01/08/2023      CHIEF COMPLAINTS/REASON FOR VISIT:  thrombocytosis  HISTORY OF PRESENTING ILLNESS:   Robin Choi is a  62 y.o.  female with PMH listed below was seen in consultation at the request of  Jerl Mina, MD  for evaluation of thrombocytosis  For the past few months, patient reports lower abdominal pressure, dysuria symptoms.  She feels like "sitting on a ball"  patient has had evaluation by PCP and gynecology.  Patient reports frequent history  of UTIs in the past. Patient had negative Pap smear, wet prep and ultrasound done by Dr. Lockie Pares. Dr. Dalbert Garnet reviewed the ultrasound results and images with patient.  No abnormal findings.  Her bimanual examination revealed cervical tenderness, uterine tenderness and minimal obturator tenderness and +bilateral pudendal nerves were tender.  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  12/11/2021, CBC showed a platelet count of.  804,000. 12/17/2021, platelet count 795,000 12/27/2021, platelet count 872,000.  Patient reports having life stressors.  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  She has to lift him multiple times in order to provide care.  She lives with her husband and brother-in-law.  Husband is on disability and not able to lift patient  Family history positive for father with carcinoma discovered in his neck, details unknown, breast cancer, colon cancer.She is overdue for colonoscopy.  She feels she could not get anyone to take care of brother-in-law during weekdays.   Oncology History  Essential thrombocythemia  01/22/2022 Imaging   CT angio chest pulmonary embolism with or without contrast showed Small volume nonocclusive pulmonary embolism in a posterior basilar left lower lobe pulmonary artery branch.     01/22/2022 Imaging   Bilateral lower extremity ultrasound negative for DVT.    02/04/2022 Imaging   CT abdomen pelvis with contrast negative, no acute findings or other significant abnormality.  03/21/2022 Imaging   MRI abdomen with and without contrast showed no acute process or explanation for left upper quadrant pain.  Aortic atherosclerosis.   06/10/2022 Imaging   CT chest angiogram PE protocol treatment no definite evidence of pulmonary embolism.  No definite abnormality seen in the chest.   06/14/2022  Initial Diagnosis   Essential thrombocythemia -  Peripheral blood JAK2 V617F mutation negative, with reflex to other mutations CALR, MPL, JAK 2 Ex 12-15 mutations negative.  Onkosight advanced NGS JAK2, MPL, CALR reflext to myeloid panel, negative,    bone marrow biopsy aspirate showed -  No immunophenotypic evidence of a lymphoproliferative disorder (no  monoclonal B cells or immunophenotypically abnormal T cells detected).  -  1% CD34 positive blasts  Bone marrow aspirate: Clot core -  Overall normocellular bone marrow with megakaryocytic hyperplasia with hyperlobulated/atypical forms with clustering  Morphologically there is a megakaryocytic hyperplasia with  hyper lobulation/hypersegmentation to the megakaryocytes in the absence  of a prominent panmyelosis.  Peripheral blood testing was negative for  the common 3 mutations seen in essential thrombocytosis and primary  myelofibrosis (JAK2, MPL and CALR).  While this could still be secondary to an underlying reactive cause the overall morphology favors a primary myeloproliferative neoplasm such as essential thrombocytosis or less likely prefibrotic primary myelofibrosis.    +chronic left upper quadrant pain.  She has had CT scan and MRI which did not explain her pain.  INTERVAL HISTORY Robin Choi is a 62 y.o. female who has above history reviewed by me today presents for follow up visit for essential thrombocythemia.  Patient was here by herself.  She take hydroxyurea 1000mg  x 1 day per week and 500mg  daily for rest of the week Patient takes Eliquis 5 mg twice daily.   Denies any bleeding events. She reports feeling very tired, no energy to finish projects at home. Life stressor includes husband's health condition etc.    Review of Systems  Constitutional:  Positive for fatigue. Negative for appetite change, chills and fever.  HENT:   Negative for hearing loss and voice change.   Eyes:  Negative for eye problems.   Respiratory:  Negative for chest tightness and cough.   Cardiovascular:  Negative for chest pain.  Gastrointestinal:  Negative for abdominal distention and blood in stool.  Endocrine: Negative for hot flashes.  Genitourinary:  Negative for difficulty urinating and frequency.   Musculoskeletal:  Positive for arthralgias.  Skin:  Negative for itching and rash.  Neurological:  Negative for dizziness, extremity weakness and headaches.  Hematological:  Negative for adenopathy.  Psychiatric/Behavioral:  Negative for confusion.     MEDICAL HISTORY:  Past Medical History:  Diagnosis Date   GERD (gastroesophageal reflux disease)    Hypothyroidism     SURGICAL HISTORY: Past Surgical History:  Procedure Laterality Date   CARDIAC CATHETERIZATION     COLONOSCOPY WITH PROPOFOL N/A 01/15/2022   Procedure: COLONOSCOPY WITH PROPOFOL;  Surgeon: Wyline Mood, MD;  Location: [facility] ENDOSCOPY;  Service: Gastroenterology;  Laterality: N/A;  Needs an early appointment.  Has disabled brother and easier to get sitter early in the morning.   ESOPHAGOGASTRODUODENOSCOPY (EGD) WITH PROPOFOL N/A 09/04/2022   Procedure: ESOPHAGOGASTRODUODENOSCOPY (EGD) WITH PROPOFOL;  Surgeon: Wyline Mood, MD;  Location: [facility] ENDOSCOPY;  Service: Gastroenterology;  Laterality: N/A;   NOVASURE ABLATION     OTHER SURGICAL HISTORY     "tubal cauterization"    SOCIAL HISTORY: Social History   Socioeconomic History   Marital status: Married  Spouse name: Not on file   Number of children: Not on file   Years of education: Not on file   Highest education level: Not on file  Occupational History   Not on file  Tobacco Use   Smoking status: Former    Types: Cigarettes    Quit date: 15    Years since quitting: 44.3   Smokeless tobacco: Never   Tobacco comments:    Quit 40 years ago  Vaping Use   Vaping Use: Never used  Substance and Sexual Activity   Alcohol use: Not Currently    Comment: occational beer   Drug  use: Never   Sexual activity: Not Currently  Other Topics Concern   Not on file  Social History Narrative   Not on file   Social Determinants of Health   Financial Resource Strain: Not on file  Food Insecurity: Not on file  Transportation Needs: Not on file  Physical Activity: Not on file  Stress: Not on file  Social Connections: Not on file  Intimate Partner Violence: Not on file    FAMILY HISTORY: Family History  Problem Relation Age of Onset   Diabetes Mother    Colon cancer Mother    Breast cancer Mother    Cancer Father        carcinoma   Bone cancer Brother    Diabetes Maternal Grandmother     ALLERGIES:  is allergic to celecoxib, nitrofurantoin macrocrystal, nitrofurantoin, and sulfa antibiotics.  MEDICATIONS:  Current Outpatient Medications  Medication Sig Dispense Refill   almotriptan (AXERT) 12.5 MG tablet      apixaban (ELIQUIS) 2.5 MG TABS tablet Take 1 tablet (2.5 mg total) by mouth 2 (two) times daily. 180 tablet 1   ARMOUR THYROID 60 MG tablet Take 60 mg by mouth daily.     cetirizine (ZYRTEC) 10 MG tablet Take by mouth.     clobetasol cream (TEMOVATE) 0.05 % Apply to affected areas hands 1-2 times a day until rash improved. Avoid face, groin, underarms. 30 g 1   ergocalciferol (VITAMIN D2) 1.25 MG (50000 UT) capsule Take 50,000 Units by mouth once a week.     famotidine (PEPCID) 40 MG tablet Take 1 tablet (40 mg total) by mouth daily. 90 tablet 0   ketoconazole (NIZORAL) 2 % cream Apply to affected areas body folds 1-2 times a day as needed until rash improved. 60 g 2   potassium chloride SA (KLOR-CON M) 20 MEQ tablet Take 1 tablet (20 mEq total) by mouth daily. 3 tablet 0   topiramate (TOPAMAX) 50 MG tablet Take 50 mg by mouth 2 (two) times daily.     valACYclovir (VALTREX) 1000 MG tablet      YUVAFEM 10 MCG TABS vaginal tablet Place vaginally.     hydroxyurea (HYDREA) 500 MG capsule Take 1 capsule (500 mg total) by mouth See admin instructions. May  take with food to minimize GI side effects. Take 1000 mg on every Wednesday, and take 500mg  daily for the rest of the week. 100 capsule 0   pantoprazole (PROTONIX) 20 MG tablet Take 1 tablet (20 mg total) by mouth 2 (two) times daily. (Patient not taking: Reported on 01/08/2023) 180 tablet 0   No current facility-administered medications for this visit.     PHYSICAL EXAMINATION: ECOG PERFORMANCE STATUS: 1 - Symptomatic but completely ambulatory Vitals:   01/08/23 1423  BP: 129/63  Pulse: 67  Resp: 18  Temp: (!) 97.5 F (36.4 C)  SpO2: 100%  Filed Weights   01/08/23 1423  Weight: 160 lb 3.2 oz (72.7 kg)    Physical Exam Constitutional:      General: She is not in acute distress. HENT:     Head: Normocephalic and atraumatic.  Eyes:     General: No scleral icterus. Cardiovascular:     Rate and Rhythm: Normal rate.  Pulmonary:     Effort: Pulmonary effort is normal. No respiratory distress.  Abdominal:     General: There is no distension.  Musculoskeletal:        General: No deformity. Normal range of motion.     Cervical back: Normal range of motion and neck supple.  Skin:    General: Skin is warm and dry.     Findings: No erythema or rash.  Neurological:     Mental Status: She is alert and oriented to person, place, and time. Mental status is at baseline.  Psychiatric:        Mood and Affect: Mood normal.     LABORATORY DATA:  I have reviewed the data as listed    Latest Ref Rng & Units 01/08/2023    2:01 PM 10/07/2022    2:04 PM 09/02/2022   10:42 AM  CBC  WBC 4.0 - 10.5 K/uL 3.9  5.1  6.0   Hemoglobin 12.0 - 15.0 g/dL 62.1  30.8  65.7   Hematocrit 36.0 - 46.0 % 36.4  37.6  39.4   Platelets 150 - 400 K/uL 337  404  474       Latest Ref Rng & Units 01/08/2023    2:01 PM 10/07/2022    2:04 PM 08/05/2022    1:21 PM  CMP  Glucose 70 - 99 mg/dL 846  962  952   BUN 8 - 23 mg/dL Creatinine 0.44 - 1.00 mg/dL 8.41  3.24  4.01   Sodium 135 - 145  mmol/L 138  137  138   Potassium 3.5 - 5.1 mmol/L 3.3  4.6  4.2   Chloride 98 - 111 mmol/L 109  106  107   CO2 22 - 32 mmol/L Calcium 8.9 - 10.3 mg/dL 8.8  9.0  9.0   Total Protein 6.5 - 8.1 g/dL 7.2  7.2  7.4   Total Bilirubin 0.3 - 1.2 mg/dL 0.5  0.3  0.5   Alkaline Phos 38 - 126 U/L 42  46  49   AST 15 - 41 U/L ALT 0 - 44 U/L Iron/TIBC/Ferritin/ %Sat    Component Value Date/Time   IRON 62 05/16/2022 1446   TIBC 288 05/16/2022 1446   FERRITIN 76 05/16/2022 1448   IRONPCTSAT 22 05/16/2022 1446      RADIOGRAPHIC STUDIES: I have personally reviewed the radiological images as listed and agreed with the findings in the report. No results found.

## 2023-01-08 NOTE — Assessment & Plan Note (Addendum)
Recommend finish currently supply of Eliquis 5 mg twice daily, then start Elqiuis 2.5mg  BID

## 2023-01-08 NOTE — Assessment & Plan Note (Addendum)
She has no anemia.  Fatigue may be due to other etiologies. She appears to have underlying mood disorders, ie Anxiety. Patient declines psychiatry referral. Will check TSH, B12, at next visit.

## 2023-01-08 NOTE — Assessment & Plan Note (Addendum)
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 Labs are reviewed and discussed with patient. Platelet counts within normal limits. .  Continue Hydroxyurea to 1000 mg x 1 day per week and hydroxyurea  daily for the rest of the week.

## 2023-01-08 NOTE — Assessment & Plan Note (Signed)
Recommnd KCL daily x 3. Rx sent.

## 2023-04-09 ENCOUNTER — Inpatient Hospital Stay: Payer: 59 | Admitting: Oncology

## 2023-04-09 ENCOUNTER — Inpatient Hospital Stay: Payer: 59 | Attending: Oncology

## 2023-04-09 ENCOUNTER — Encounter: Payer: Self-pay | Admitting: Oncology

## 2023-04-09 VITALS — BP 128/80 | HR 71 | Temp 97.6°F | Resp 18 | Wt 150.3 lb

## 2023-04-09 DIAGNOSIS — I2699 Other pulmonary embolism without acute cor pulmonale: Secondary | ICD-10-CM | POA: Insufficient documentation

## 2023-04-09 DIAGNOSIS — Z800 Family history of malignant neoplasm of digestive organs: Secondary | ICD-10-CM | POA: Diagnosis not present

## 2023-04-09 DIAGNOSIS — D473 Essential (hemorrhagic) thrombocythemia: Secondary | ICD-10-CM | POA: Diagnosis not present

## 2023-04-09 DIAGNOSIS — Z87891 Personal history of nicotine dependence: Secondary | ICD-10-CM | POA: Insufficient documentation

## 2023-04-09 DIAGNOSIS — Z808 Family history of malignant neoplasm of other organs or systems: Secondary | ICD-10-CM | POA: Insufficient documentation

## 2023-04-09 DIAGNOSIS — D471 Chronic myeloproliferative disease: Secondary | ICD-10-CM | POA: Insufficient documentation

## 2023-04-09 DIAGNOSIS — Z803 Family history of malignant neoplasm of breast: Secondary | ICD-10-CM | POA: Insufficient documentation

## 2023-04-09 DIAGNOSIS — R5383 Other fatigue: Secondary | ICD-10-CM

## 2023-04-09 DIAGNOSIS — Z7901 Long term (current) use of anticoagulants: Secondary | ICD-10-CM | POA: Diagnosis not present

## 2023-04-09 DIAGNOSIS — Z87440 Personal history of urinary (tract) infections: Secondary | ICD-10-CM | POA: Diagnosis not present

## 2023-04-09 LAB — CBC WITH DIFFERENTIAL (CANCER CENTER ONLY)
Abs Immature Granulocytes: 0.02 10*3/uL (ref 0.00–0.07)
Basophils Absolute: 0.1 10*3/uL (ref 0.0–0.1)
Basophils Relative: 1 %
Eosinophils Absolute: 0.2 10*3/uL (ref 0.0–0.5)
Eosinophils Relative: 4 %
HCT: 36.3 % (ref 36.0–46.0)
Hemoglobin: 12 g/dL (ref 12.0–15.0)
Immature Granulocytes: 0 %
Lymphocytes Relative: 36 %
Lymphs Abs: 1.7 10*3/uL (ref 0.7–4.0)
MCH: 34.7 pg — ABNORMAL HIGH (ref 26.0–34.0)
MCHC: 33.1 g/dL (ref 30.0–36.0)
MCV: 104.9 fL — ABNORMAL HIGH (ref 80.0–100.0)
Monocytes Absolute: 0.4 10*3/uL (ref 0.1–1.0)
Monocytes Relative: 9 %
Neutro Abs: 2.3 10*3/uL (ref 1.7–7.7)
Neutrophils Relative %: 50 %
Platelet Count: 312 10*3/uL (ref 150–400)
RBC: 3.46 MIL/uL — ABNORMAL LOW (ref 3.87–5.11)
RDW: 12.6 % (ref 11.5–15.5)
WBC Count: 4.7 10*3/uL (ref 4.0–10.5)
nRBC: 0 % (ref 0.0–0.2)

## 2023-04-09 LAB — CMP (CANCER CENTER ONLY)
ALT: 18 U/L (ref 0–44)
AST: 19 U/L (ref 15–41)
Albumin: 4.3 g/dL (ref 3.5–5.0)
Alkaline Phosphatase: 44 U/L (ref 38–126)
Anion gap: 6 (ref 5–15)
BUN: 18 mg/dL (ref 8–23)
CO2: 22 mmol/L (ref 22–32)
Calcium: 8.7 mg/dL — ABNORMAL LOW (ref 8.9–10.3)
Chloride: 107 mmol/L (ref 98–111)
Creatinine: 0.84 mg/dL (ref 0.44–1.00)
GFR, Estimated: >60 mL/min (ref >60)
Glucose, Bld: 108 mg/dL — ABNORMAL HIGH (ref 70–99)
Potassium: 3.6 mmol/L (ref 3.5–5.1)
Sodium: 135 mmol/L (ref 135–145)
Total Bilirubin: 0.5 mg/dL (ref 0.3–1.2)
Total Protein: 7.6 g/dL (ref 6.5–8.1)

## 2023-04-09 LAB — VITAMIN B12: Vitamin B-12: 363 pg/mL (ref 180–914)

## 2023-04-09 LAB — TSH: TSH: 2.371 u[IU]/mL (ref 0.350–4.500)

## 2023-04-09 NOTE — Progress Notes (Signed)
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 Labs are reviewed and discussed with patient. Platelet counts within normal limits. .  Patient prefers to take a treatment break due to muscle pain. She prefers to try a different medication. Recommend patient to hold hydroxyurea for 2 weeks, if this helps her arthralgia, recommend patient to try anagrelide. She will call my office to update me.  Pulmonary embolism (HCC) Recommend  Elqiuis 2.5mg  BID   Orders Placed This Encounter  Procedures   CBC with Differential (Cancer Center Only)    Standing Status:   Future    Standing Expiration Date:   04/08/2024   CMP (Cancer Center only)    Standing Status:   Future    Standing Expiration Date:   04/08/2024   Follow-up  3 months lab MD cbc cmp   All questions were answered. The patient knows to call the clinic with any problems, questions or concerns.  Robin Patience, MD, PhD [facility] Hematology Oncology 04/09/2023      CHIEF COMPLAINTS/REASON FOR VISIT:  thrombocytosis  HISTORY OF PRESENTING ILLNESS:   Robin Choi is a  62 y.o.  female with PMH listed below was seen in consultation at the request of  Jerl Mina, MD  for evaluation of thrombocytosis  For the past few months, patient reports lower abdominal pressure, dysuria symptoms.  She feels like "sitting on a ball"  patient has had evaluation by PCP and gynecology.  Patient reports frequent history of UTIs in the past. Patient had negative Pap smear, wet prep and ultrasound done by Dr. Lockie Pares. Dr. Dalbert Garnet reviewed the ultrasound results and images with  patient.  No abnormal findings.  Her bimanual examination revealed cervical tenderness, uterine tenderness and minimal obturator tenderness and +bilateral pudendal nerves were tender.  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  12/11/2021, CBC showed a platelet count of.  804,000. 12/17/2021, platelet count 795,000 12/27/2021, platelet count 872,000.  Patient reports having life stressors.  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  She has to lift him multiple times in order to provide care.  She lives with her husband and brother-in-law.  Husband is on disability and not able to lift patient  Family history positive for father with carcinoma discovered in his neck, details unknown, breast cancer, colon cancer.She is overdue for colonoscopy.  She feels she could not get anyone to take care of brother-in-law during weekdays.   Oncology History  Essential thrombocythemia (HCC)  01/22/2022 Imaging   CT angio chest pulmonary embolism with or without contrast showed Small volume nonocclusive pulmonary embolism in a posterior basilar left lower lobe pulmonary artery branch.     01/22/2022 Imaging   Bilateral lower extremity ultrasound negative for DVT.    02/04/2022 Imaging   CT abdomen pelvis with contrast negative, no acute findings or other significant abnormality.   03/21/2022 Imaging   MRI abdomen with and without contrast showed no acute process or explanation for left upper quadrant pain.  Aortic atherosclerosis.   06/10/2022 Imaging  CT chest angiogram PE protocol treatment no definite evidence of pulmonary embolism.  No definite abnormality seen in the chest.   06/14/2022 Initial Diagnosis   Essential thrombocythemia -  Peripheral blood JAK2 V617F mutation negative, with reflex to other mutations CALR, MPL, JAK 2 Ex 12-15  mutations negative.  Onkosight advanced NGS JAK2, MPL, CALR reflext to myeloid panel, negative,    bone marrow biopsy aspirate showed -  No immunophenotypic evidence of a lymphoproliferative disorder (no  monoclonal B cells or immunophenotypically abnormal T cells detected).  -  1% CD34 positive blasts  Bone marrow aspirate: Clot core -  Overall normocellular bone marrow with megakaryocytic hyperplasia with hyperlobulated/atypical forms with clustering  Morphologically there is a megakaryocytic hyperplasia with  hyper lobulation/hypersegmentation to the megakaryocytes in the absence  of a prominent panmyelosis.  Peripheral blood testing was negative for  the common 3 mutations seen in essential thrombocytosis and primary  myelofibrosis (JAK2, MPL and CALR).  While this could still be secondary to an underlying reactive cause the overall morphology favors a primary myeloproliferative neoplasm such as essential thrombocytosis or less likely prefibrotic primary myelofibrosis.    +chronic left upper quadrant pain.  She has had CT scan and MRI which did not explain her pain.  INTERVAL HISTORY Robin Choi is a 62 y.o. female who has above history reviewed by me today presents for follow up visit for essential thrombocythemia.  Patient was here by herself.  She take hydroxyurea 1000mg  x 1 day per week and 500mg  daily for rest of the week Patient takes Eliquis 2.5 mg twice daily.   Denies any bleeding events. Patient reports multiple joint arthralgia as well as muscle pain.  She feels that the symptoms started after hydroxyurea was started.   Review of Systems  Constitutional:  Positive for fatigue. Negative for appetite change, chills and fever.  HENT:   Negative for hearing loss and voice change.   Eyes:  Negative for eye problems.  Respiratory:  Negative for chest tightness and cough.   Cardiovascular:  Negative for chest pain.  Gastrointestinal:  Negative for abdominal distention  and blood in stool.  Endocrine: Negative for hot flashes.  Genitourinary:  Negative for difficulty urinating and frequency.   Musculoskeletal:  Positive for arthralgias.  Skin:  Negative for itching and rash.  Neurological:  Negative for dizziness, extremity weakness and headaches.  Hematological:  Negative for adenopathy.  Psychiatric/Behavioral:  Negative for confusion.     MEDICAL HISTORY:  Past Medical History:  Diagnosis Date   GERD (gastroesophageal reflux disease)    Hypothyroidism     SURGICAL HISTORY: Past Surgical History:  Procedure Laterality Date   CARDIAC CATHETERIZATION     COLONOSCOPY WITH PROPOFOL N/A 01/15/2022   Procedure: COLONOSCOPY WITH PROPOFOL;  Surgeon: Wyline Mood, MD;  Location: [facility] ENDOSCOPY;  Service: Gastroenterology;  Laterality: N/A;  Needs an early appointment.  Has disabled brother and easier to get sitter early in the morning.   ESOPHAGOGASTRODUODENOSCOPY (EGD) WITH PROPOFOL N/A 09/04/2022   Procedure: ESOPHAGOGASTRODUODENOSCOPY (EGD) WITH PROPOFOL;  Surgeon: Wyline Mood, MD;  Location: [facility] ENDOSCOPY;  Service: Gastroenterology;  Laterality: N/A;   NOVASURE ABLATION     OTHER SURGICAL HISTORY     "tubal cauterization"    SOCIAL HISTORY: Social History   Socioeconomic History   Marital status: Married    Spouse name: Not on file   Number of children: Not on file   Years of education: Not on file   Highest education level: Not  on file  Occupational History   Not on file  Tobacco Use   Smoking status: Former    Current packs/day: 0.00    Types: Cigarettes    Quit date: 1980    Years since quitting: 44.5   Smokeless tobacco: Never   Tobacco comments:    Quit 40 years ago  Vaping Use   Vaping status: Never Used  Substance and Sexual Activity   Alcohol use: Not Currently    Comment: occational beer   Drug use: Never   Sexual activity: Not Currently  Other Topics Concern   Not on file  Social History Narrative   Not on file    Social Determinants of Health   Financial Resource Strain: Patient Declined (07/01/2022)   Received from [facility], [facility]   Overall Financial Resource Strain (CARDIA)    Difficulty of Paying Living Expenses: Patient declined  Food Insecurity: Patient Declined (07/01/2022)   Received [facility], [facility]   Hunger Vital Sign    Worried About Running Out of Food in the Last Year: Patient declined    Ran Out of Food in the Last Year: Patient declined  Transportation Needs: Patient Declined (07/01/2022)   Received from [facility], [facility]   PRAPARE - Transportation    In the past 12 months, has lack of transportation kept you from medical appointments or from getting medications?: Patient declined    Lack of Transportation (Non-Medical): Patient declined  Physical Activity: Not on file  Stress: Not on file  Social Connections: Not on file  Intimate Partner Violence: Not on file    FAMILY HISTORY: Family History  Problem Relation Age of Onset   Diabetes Mother    Colon cancer Mother    Breast cancer Mother    Cancer Father        carcinoma   Bone cancer Brother    Diabetes Maternal Grandmother     ALLERGIES:  is allergic to celecoxib, nitrofurantoin macrocrystal, nitrofurantoin, and sulfa antibiotics.  MEDICATIONS:  Current Outpatient Medications  Medication Sig Dispense Refill   apixaban (ELIQUIS) 2.5 MG TABS tablet Take 1 tablet (2.5 mg total) by mouth 2 (two) times daily. 180 tablet 1   ARMOUR THYROID 60 MG tablet Take 60 mg by mouth daily.     cetirizine (ZYRTEC) 10 MG tablet Take by mouth.     clobetasol cream (TEMOVATE) 0.05 % Apply to affected areas hands 1-2 times a day until rash improved. Avoid face, groin, underarms. 30 g 1   ergocalciferol (VITAMIN D2) 1.25 MG (50000 UT) capsule Take 50,000 Units by mouth once a week.     hydroxyurea  (HYDREA) 500 MG capsule Take 1 capsule (500 mg total) by mouth See admin instructions. May take with food to minimize GI side effects. Take 1000 mg on every Wednesday, and take 500mg  daily for the rest of the week. 100 capsule 0   ketoconazole (NIZORAL) 2 % cream Apply to affected areas body folds 1-2 times a day as needed until rash improved. 60 g 2   LANSOPRAZOLE PO Take by mouth.     topiramate (TOPAMAX) 50 MG tablet Take 50 mg by mouth 2 (two) times daily.     almotriptan (AXERT) 12.5 MG tablet  (Patient not taking: Reported on 04/09/2023)     potassium chloride SA (KLOR-CON M) 20 MEQ tablet Take 1 tablet (20 mEq total) by mouth daily. (Patient not taking:  Reported on 04/09/2023) 3 tablet 0   valACYclovir (VALTREX) 1000 MG tablet  (Patient not taking: Reported on 04/09/2023)     No current facility-administered medications for this visit.     PHYSICAL EXAMINATION: ECOG PERFORMANCE STATUS: 1 - Symptomatic but completely ambulatory Vitals:   04/09/23 1457  BP: 128/80  Pulse: 71  Resp: 18  Temp: 97.6 F (36.4 C)   Filed Weights   04/09/23 1457  Weight: 150 lb 4.8 oz (68.2 kg)    Physical Exam Constitutional:      General: She is not in acute distress. HENT:     Head: Normocephalic and atraumatic.  Eyes:     General: No scleral icterus. Cardiovascular:     Rate and Rhythm: Normal rate.  Pulmonary:     Effort: Pulmonary effort is normal. No respiratory distress.  Abdominal:     General: There is no distension.  Musculoskeletal:        General: No deformity. Normal range of motion.     Cervical back: Normal range of motion and neck supple.  Skin:    General: Skin is warm and dry.     Findings: No erythema or rash.  Neurological:     Mental Status: She is alert and oriented to person, place, and time. Mental status is at baseline.  Psychiatric:        Mood and Affect: Mood normal.     LABORATORY DATA:  I have reviewed the data as listed    Latest Ref Rng & Units  04/09/2023    2:25 PM 01/08/2023    2:01 PM 10/07/2022    2:04 PM  CBC  WBC 4.0 - 10.5 K/uL 4.7  3.9  5.1   Hemoglobin 12.0 - 15.0 g/dL 01.0  27.2  53.6   Hematocrit 36.0 - 46.0 % 36.3  36.4  37.6   Platelets 150 - 400 K/uL 312  337  404       Latest Ref Rng & Units 04/09/2023    2:25 PM 01/08/2023    2:01 PM 10/07/2022    2:04 PM  CMP  Glucose 70 - 99 mg/dL 644  034  742   BUN 8 - 23 mg/dL 18  18  18    Creatinine 0.44 - 1.00 mg/dL 5.95  6.38  7.56   Sodium 135 - 145 mmol/L 135  138  137   Potassium 3.5 - 5.1 mmol/L 3.6  3.3  4.6   Chloride 98 - 111 mmol/L 107  109  106   CO2 22 - 32 mmol/L 22  23  23    Calcium 8.9 - 10.3 mg/dL 8.7  8.8  9.0   Total Protein 6.5 - 8.1 g/dL 7.6  7.2  7.2   Total Bilirubin 0.3 - 1.2 mg/dL 0.5  0.5  0.3   Alkaline Phos 38 - 126 U/L 44  42  46   AST 15 - 41 U/L 19  19  21    ALT 0 - 44 U/L 18  19  19      Iron/TIBC/Ferritin/ %Sat    Component Value Date/Time   IRON 62 05/16/2022 1446   TIBC 288 05/16/2022 1446   FERRITIN 76 05/16/2022 1448   IRONPCTSAT 22 05/16/2022 1446      RADIOGRAPHIC STUDIES: I have personally reviewed the radiological images as listed and agreed with the findings in the report. No results found.

## 2023-04-09 NOTE — Assessment & Plan Note (Signed)
Recommend  Elqiuis 2.5mg  BID

## 2023-04-09 NOTE — Progress Notes (Signed)
Pt here for follow up. Pt reports that she has been having muscle and joint pain, she thinks it may be related to the medication.

## 2023-04-09 NOTE — Assessment & Plan Note (Signed)
She has no anemia.  Fatigue may be due to other etiologies. She appears to have underlying mood disorders, ie Anxiety. Patient declines psychiatry referral.  Normal B12 and TSH.

## 2023-04-09 NOTE — Assessment & Plan Note (Addendum)
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 Labs are reviewed and discussed with patient. Platelet counts within normal limits. .  Patient prefers to take a treatment break due to muscle pain. She prefers to try a different medication. Recommend patient to hold hydroxyurea for 2 weeks, if this helps her arthralgia, recommend patient to try anagrelide. She will call my office to update me.

## 2023-04-23 ENCOUNTER — Telehealth: Payer: Self-pay | Admitting: *Deleted

## 2023-04-23 ENCOUNTER — Other Ambulatory Visit: Payer: Self-pay | Admitting: Oncology

## 2023-04-23 MED ORDER — ANAGRELIDE HCL 1 MG PO CAPS: 1.0000 mg | ORAL_CAPSULE | Freq: Two times a day (BID) | ORAL | 0 refills | Status: DC

## 2023-04-23 MED ORDER — HYDROXYUREA 500 MG PO CAPS: 500.0000 mg | ORAL_CAPSULE | ORAL | 0 refills | Status: DC

## 2023-04-23 NOTE — Telephone Encounter (Signed)
Pt seen 04/09/23 by Dr Cathie Hoops and has not been taking Hydroxyurea.  MD instructed pt to call with update.  Pt reports she would like to resume medication but MD had discussed possibly changing medication.  Please advise.  Next follow up is scheduled for 07/15/23.

## 2023-05-05 ENCOUNTER — Telehealth: Payer: Self-pay

## 2023-05-05 NOTE — Telephone Encounter (Signed)
Patient left message stating that the new medication Dr Cathie Hoops put her on is causing headaches, she stopped taking it and headaches improved. She would like to discuss changing it to her previous medication.  [id number]

## 2023-05-06 NOTE — Telephone Encounter (Signed)
Left detailed VM and sent mychart message to let pt to hold off on medication, per Dr. Cathie Hoops and to keep appt on 8/22 for further discussion.

## 2023-05-15 ENCOUNTER — Inpatient Hospital Stay (HOSPITAL_BASED_OUTPATIENT_CLINIC_OR_DEPARTMENT_OTHER): Payer: 59 | Admitting: Oncology

## 2023-05-15 ENCOUNTER — Inpatient Hospital Stay: Payer: 59 | Attending: Oncology

## 2023-05-15 ENCOUNTER — Encounter: Payer: Self-pay | Admitting: Oncology

## 2023-05-15 VITALS — BP 124/61 | HR 69 | Temp 97.7°F | Resp 18 | Wt 161.1 lb

## 2023-05-15 DIAGNOSIS — D473 Essential (hemorrhagic) thrombocythemia: Secondary | ICD-10-CM

## 2023-05-15 DIAGNOSIS — I2699 Other pulmonary embolism without acute cor pulmonale: Secondary | ICD-10-CM | POA: Insufficient documentation

## 2023-05-15 DIAGNOSIS — R5383 Other fatigue: Secondary | ICD-10-CM | POA: Insufficient documentation

## 2023-05-15 DIAGNOSIS — Z87891 Personal history of nicotine dependence: Secondary | ICD-10-CM | POA: Diagnosis not present

## 2023-05-15 DIAGNOSIS — D471 Chronic myeloproliferative disease: Secondary | ICD-10-CM | POA: Insufficient documentation

## 2023-05-15 DIAGNOSIS — Z7901 Long term (current) use of anticoagulants: Secondary | ICD-10-CM | POA: Insufficient documentation

## 2023-05-15 DIAGNOSIS — Z803 Family history of malignant neoplasm of breast: Secondary | ICD-10-CM | POA: Insufficient documentation

## 2023-05-15 DIAGNOSIS — Z800 Family history of malignant neoplasm of digestive organs: Secondary | ICD-10-CM | POA: Diagnosis not present

## 2023-05-15 LAB — CMP (CANCER CENTER ONLY)
ALT: 17 U/L (ref 0–44)
AST: 18 U/L (ref 15–41)
Albumin: 4.2 g/dL (ref 3.5–5.0)
Alkaline Phosphatase: 47 U/L (ref 38–126)
Anion gap: 7 (ref 5–15)
BUN: 15 mg/dL (ref 8–23)
CO2: 24 mmol/L (ref 22–32)
Calcium: 9.1 mg/dL (ref 8.9–10.3)
Chloride: 109 mmol/L (ref 98–111)
Creatinine: 0.85 mg/dL (ref 0.44–1.00)
GFR, Estimated: >60 mL/min (ref >60)
Glucose, Bld: 104 mg/dL — ABNORMAL HIGH (ref 70–99)
Potassium: 4 mmol/L (ref 3.5–5.1)
Sodium: 140 mmol/L (ref 135–145)
Total Bilirubin: 0.4 mg/dL (ref 0.3–1.2)
Total Protein: 7.5 g/dL (ref 6.5–8.1)

## 2023-05-15 LAB — CBC WITH DIFFERENTIAL (CANCER CENTER ONLY)
Abs Immature Granulocytes: 0.02 10*3/uL (ref 0.00–0.07)
Basophils Absolute: 0 10*3/uL (ref 0.0–0.1)
Basophils Relative: 1 %
Eosinophils Absolute: 0.3 10*3/uL (ref 0.0–0.5)
Eosinophils Relative: 5 %
HCT: 37 % (ref 36.0–46.0)
Hemoglobin: 12.3 g/dL (ref 12.0–15.0)
Immature Granulocytes: 0 %
Lymphocytes Relative: 30 %
Lymphs Abs: 1.8 10*3/uL (ref 0.7–4.0)
MCH: 34.2 pg — ABNORMAL HIGH (ref 26.0–34.0)
MCHC: 33.2 g/dL (ref 30.0–36.0)
MCV: 102.8 fL — ABNORMAL HIGH (ref 80.0–100.0)
Monocytes Absolute: 0.5 10*3/uL (ref 0.1–1.0)
Monocytes Relative: 8 %
Neutro Abs: 3.4 10*3/uL (ref 1.7–7.7)
Neutrophils Relative %: 56 %
Platelet Count: 471 10*3/uL — ABNORMAL HIGH (ref 150–400)
RBC: 3.6 MIL/uL — ABNORMAL LOW (ref 3.87–5.11)
RDW: 11.7 % (ref 11.5–15.5)
WBC Count: 6.1 10*3/uL (ref 4.0–10.5)
nRBC: 0 % (ref 0.0–0.2)

## 2023-05-15 MED ORDER — HYDROXYUREA 500 MG PO CAPS: 500.0000 mg | ORAL_CAPSULE | ORAL | 3 refills | Status: DC

## 2023-05-15 MED ORDER — APIXABAN 2.5 MG PO TABS: 2.5000 mg | ORAL_TABLET | Freq: Two times a day (BID) | ORAL | 1 refills | Status: DC

## 2023-05-15 NOTE — Progress Notes (Signed)
Pt here for follow up reports that "ligament" pain has improved. She has started a calcium supplement.

## 2023-05-15 NOTE — Assessment & Plan Note (Signed)
Recommend  Elqiuis 2.5mg  BID

## 2023-05-15 NOTE — Assessment & Plan Note (Addendum)
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 Labs are reviewed and discussed with patient. Platelet counts slightly increased She can not tolerate Anagrelide. She prefers to get back on Hydroxyurea Recommend hydroxyurea 1000mg  x 1 day per week and 500mg  daily for rest of the week. Refills were sent to pharmacy

## 2023-05-15 NOTE — Assessment & Plan Note (Signed)
She has no anemia. Normal B12 and TSH. Fatigue may be due to other etiologies. She appears to have underlying mood disorders, ie Anxiety.  Patient declines psychiatry referral.

## 2023-05-15 NOTE — Progress Notes (Signed)
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 Labs are reviewed and discussed with patient. Platelet counts slightly increased She can not tolerate Anagrelide. She prefers to get back on Hydroxyurea Recommend hydroxyurea 1000mg  x 1 day per week and 500mg  daily for rest of the week. Refills were sent to pharmacy  Pulmonary embolism (HCC) Recommend  Elqiuis 2.5mg  BID  Fatigue She has no anemia. Normal B12 and TSH. Fatigue may be due to other etiologies. She appears to have underlying mood disorders, ie Anxiety.  Patient declines psychiatry referral.     Orders Placed This Encounter  Procedures   CMP (Cancer Center only)    Standing Status:   Future    Standing Expiration Date:   05/14/2024   CBC with Differential (Cancer Center Only)    Standing Status:   Future    Standing Expiration Date:   05/14/2024   Follow-up  3 months lab MD cbc cmp   All questions were answered. The patient knows to call the clinic with any problems, questions or concerns.  Rickard Patience, MD, PhD [facility] Hematology Oncology 05/15/2023      CHIEF COMPLAINTS/REASON FOR VISIT:  thrombocytosis  HISTORY OF PRESENTING ILLNESS:   Robin Choi is a  62 y.o.  female with PMH listed below was seen in consultation at the request of  Jerl Mina, MD  for evaluation of thrombocytosis  For the past few months, patient reports lower abdominal pressure, dysuria symptoms.  She feels like "sitting on a ball"  patient has had evaluation by PCP and gynecology.  Patient reports frequent history of  UTIs in the past. Patient had negative Pap smear, wet prep and ultrasound done by Dr. Lockie Pares. Dr. Dalbert Garnet reviewed the ultrasound results and images with patient.  No abnormal findings.  Her bimanual examination revealed cervical tenderness, uterine tenderness and minimal obturator tenderness and +bilateral pudendal nerves were tender.  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  12/11/2021, CBC showed a platelet count of.  804,000. 12/17/2021, platelet count 795,000 12/27/2021, platelet count 872,000.  Patient reports having life stressors.  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  She has to lift him multiple times in order to provide care.  She lives with her husband and brother-in-law.  Husband is on disability and not able to lift patient  Family history positive for father with carcinoma discovered in his neck, details unknown, breast cancer, colon cancer.She is overdue for colonoscopy.  She feels she could not get anyone to take care of brother-in-law during weekdays.   Oncology History  Essential thrombocythemia (HCC)  01/22/2022 Imaging   CT angio chest pulmonary embolism with or without contrast showed Small volume nonocclusive pulmonary embolism in a posterior basilar left lower lobe pulmonary artery branch.     01/22/2022 Imaging   Bilateral lower extremity ultrasound negative for DVT.    02/04/2022 Imaging   CT abdomen pelvis with contrast negative, no acute findings or other significant abnormality.   03/21/2022 Imaging  MRI abdomen with and without contrast showed no acute process or explanation for left upper quadrant pain.  Aortic atherosclerosis.   06/10/2022 Imaging   CT chest angiogram PE protocol treatment no definite evidence of pulmonary embolism.  No definite abnormality seen in the chest.   06/14/2022  Initial Diagnosis   Essential thrombocythemia -  Peripheral blood JAK2 V617F mutation negative, with reflex to other mutations CALR, MPL, JAK 2 Ex 12-15 mutations negative.  Onkosight advanced NGS JAK2, MPL, CALR reflext to myeloid panel, negative,    bone marrow biopsy aspirate showed -  No immunophenotypic evidence of a lymphoproliferative disorder (no  monoclonal B cells or immunophenotypically abnormal T cells detected).  -  1% CD34 positive blasts  Bone marrow aspirate: Clot core -  Overall normocellular bone marrow with megakaryocytic hyperplasia with hyperlobulated/atypical forms with clustering  Morphologically there is a megakaryocytic hyperplasia with  hyper lobulation/hypersegmentation to the megakaryocytes in the absence  of a prominent panmyelosis.  Peripheral blood testing was negative for  the common 3 mutations seen in essential thrombocytosis and primary  myelofibrosis (JAK2, MPL and CALR).  While this could still be secondary to an underlying reactive cause the overall morphology favors a primary myeloproliferative neoplasm such as essential thrombocytosis or less likely prefibrotic primary myelofibrosis.    +chronic left upper quadrant pain.  She has had CT scan and MRI which did not explain her pain.  INTERVAL HISTORY Robin Choi is a 62 y.o. female who has above history reviewed by me today presents for follow up visit for essential thrombocythemia.  Patient was here by herself.  During the interval she tried anagrelide and was not able to tolerate due to headache. She was recommended to stop medication last week.  Patient takes Eliquis 2.5 mg twice daily.   Denies any bleeding events. Multiple joint arthralgia as well as muscle pain were better after calcium supplementation.  Feels very tired, chronic problem for her.    Review of Systems  Constitutional:  Positive for fatigue. Negative for appetite change, chills and fever.  HENT:   Negative for hearing  loss and voice change.   Eyes:  Negative for eye problems.  Respiratory:  Negative for chest tightness and cough.   Cardiovascular:  Negative for chest pain.  Gastrointestinal:  Negative for abdominal distention and blood in stool.  Endocrine: Negative for hot flashes.  Genitourinary:  Negative for difficulty urinating and frequency.   Musculoskeletal:  Positive for arthralgias.  Skin:  Negative for itching and rash.  Neurological:  Negative for dizziness, extremity weakness and headaches.  Hematological:  Negative for adenopathy.  Psychiatric/Behavioral:  Negative for confusion.     MEDICAL HISTORY:  Past Medical History:  Diagnosis Date   GERD (gastroesophageal reflux disease)    Hypothyroidism     SURGICAL HISTORY: Past Surgical History:  Procedure Laterality Date   CARDIAC CATHETERIZATION     COLONOSCOPY WITH PROPOFOL N/A 01/15/2022   Procedure: COLONOSCOPY WITH PROPOFOL;  Surgeon: Wyline Mood, MD;  Location: [facility] ENDOSCOPY;  Service: Gastroenterology;  Laterality: N/A;  Needs an early appointment.  Has disabled brother and easier to get sitter early in the morning.   ESOPHAGOGASTRODUODENOSCOPY (EGD) WITH PROPOFOL N/A 09/04/2022   Procedure: ESOPHAGOGASTRODUODENOSCOPY (EGD) WITH PROPOFOL;  Surgeon: Wyline Mood, MD;  Location: [facility] ENDOSCOPY;  Service: Gastroenterology;  Laterality: N/A;   NOVASURE ABLATION     OTHER SURGICAL HISTORY     "tubal cauterization"    SOCIAL HISTORY: Social History   Socioeconomic History  Marital status: Married    Spouse name: Not on file   Number of children: Not on file   Years of education: Not on file   Highest education level: Not on file  Occupational History   Not on file  Tobacco Use   Smoking status: Former    Current packs/day: 0.00    Types: Cigarettes    Quit date: 1980    Years since quitting: 44.6   Smokeless tobacco: Never   Tobacco comments:    Quit 40 years ago  Vaping Use   Vaping status: Never Used  Substance  and Sexual Activity   Alcohol use: Not Currently    Comment: occational beer   Drug use: Never   Sexual activity: Not Currently  Other Topics Concern   Not on file  Social History Narrative   Not on file   Social Determinants of Health   Financial Resource Strain: Patient Declined (07/01/2022)   Received from [facility], [facility]   Overall Financial Resource Strain (CARDIA)    Difficulty of Paying Living Expenses: Patient declined  Food Insecurity: Patient Declined (07/01/2022)   Received from [facility], [facility]   Hunger Vital Sign    Worried About Running Out of Food in the Last Year: Patient declined    Ran Out of Food in the Last Year: Patient declined  Transportation Needs: Patient Declined (07/01/2022)   Received from [facility], [facility]   PRAPARE - Transportation    In the past 12 months, has lack of transportation kept you from medical appointments or from getting medications?: Patient declined    Lack of Transportation (Non-Medical): Patient declined  Physical Activity: Not on file  Stress: Not on file  Social Connections: Not on file  Intimate Partner Violence: Not on file    FAMILY HISTORY: Family History  Problem Relation Age of Onset   Diabetes Mother    Colon cancer Mother    Breast cancer Mother    Cancer Father        carcinoma   Bone cancer Brother    Diabetes Maternal Grandmother     ALLERGIES:  is allergic to celecoxib, nitrofurantoin macrocrystal, nitrofurantoin, and sulfa antibiotics.  MEDICATIONS:  Current Outpatient Medications  Medication Sig Dispense Refill   almotriptan (AXERT) 12.5 MG tablet      ARMOUR THYROID 60 MG tablet Take 60 mg by mouth daily.     cetirizine (ZYRTEC) 10 MG tablet Take by mouth.     clobetasol cream (TEMOVATE) 0.05 % Apply to affected areas hands 1-2 times a day until rash improved. Avoid face,  groin, underarms. 30 g 1   ergocalciferol (VITAMIN D2) 1.25 MG (50000 UT) capsule Take 50,000 Units by mouth once a week.     ketoconazole (NIZORAL) 2 % cream Apply to affected areas body folds 1-2 times a day as needed until rash improved. 60 g 2   LANSOPRAZOLE PO Take by mouth.     topiramate (TOPAMAX) 50 MG tablet Take 50 mg by mouth 2 (two) times daily.     valACYclovir (VALTREX) 1000 MG tablet      apixaban (ELIQUIS) 2.5 MG TABS tablet Take 1 tablet (2.5 mg total) by mouth 2 (two) times daily. 180 tablet 1   hydroxyurea (HYDREA) 500 MG capsule Take 1 capsule (500 mg total) by mouth See admin instructions. May take with food to minimize GI side effects. Take 1000 mg on  every Wednesday, and take 500mg  daily for the rest of the week. 100 capsule 3   potassium chloride SA (KLOR-CON M) 20 MEQ tablet Take 1 tablet (20 mEq total) by mouth daily. (Patient not taking: Reported on 04/09/2023) 3 tablet 0   No current facility-administered medications for this visit.     PHYSICAL EXAMINATION: ECOG PERFORMANCE STATUS: 1 - Symptomatic but completely ambulatory Vitals:   05/15/23 1439  BP: 124/61  Pulse: 69  Resp: 18  Temp: 97.7 F (36.5 C)   Filed Weights   05/15/23 1439  Weight: 161 lb 1.6 oz (73.1 kg)    Physical Exam Constitutional:      General: She is not in acute distress. HENT:     Head: Normocephalic and atraumatic.  Eyes:     General: No scleral icterus. Cardiovascular:     Rate and Rhythm: Normal rate.  Pulmonary:     Effort: Pulmonary effort is normal. No respiratory distress.  Abdominal:     General: There is no distension.  Musculoskeletal:        General: No deformity. Normal range of motion.     Cervical back: Normal range of motion and neck supple.  Skin:    General: Skin is warm and dry.     Findings: No erythema or rash.  Neurological:     Mental Status: She is alert and oriented to person, place, and time. Mental status is at baseline.  Psychiatric:         Mood and Affect: Mood normal.     LABORATORY DATA:  I have reviewed the data as listed    Latest Ref Rng & Units 05/15/2023    2:14 PM 04/09/2023    2:25 PM 01/08/2023    2:01 PM  CBC  WBC 4.0 - 10.5 K/uL 6.1  4.7  3.9   Hemoglobin 12.0 - 15.0 g/dL 88.4  16.6  06.3   Hematocrit 36.0 - 46.0 % 37.0  36.3  36.4   Platelets 150 - 400 K/uL 471  312  337       Latest Ref Rng & Units 05/15/2023    2:13 PM 04/09/2023    2:25 PM 01/08/2023    2:01 PM  CMP  Glucose 70 - 99 mg/dL 016  010  932   BUN 8 - 23 mg/dL 15  18  18    Creatinine 0.44 - 1.00 mg/dL 3.55  7.32  2.02   Sodium 135 - 145 mmol/L 140  135  138   Potassium 3.5 - 5.1 mmol/L 4.0  3.6  3.3   Chloride 98 - 111 mmol/L 109  107  109   CO2 22 - 32 mmol/L 24  22  23    Calcium 8.9 - 10.3 mg/dL 9.1  8.7  8.8   Total Protein 6.5 - 8.1 g/dL 7.5  7.6  7.2   Total Bilirubin 0.3 - 1.2 mg/dL 0.4  0.5  0.5   Alkaline Phos 38 - 126 U/L 47  44  42   AST 15 - 41 U/L 18  19  19    ALT 0 - 44 U/L 17  18  19      Iron/TIBC/Ferritin/ %Sat    Component Value Date/Time   IRON 62 05/16/2022 1446   TIBC 288 05/16/2022 1446   FERRITIN 76 05/16/2022 1448   IRONPCTSAT 22 05/16/2022 1446      RADIOGRAPHIC STUDIES: I have personally reviewed the radiological images as listed and agreed with the findings in the report.  No results found.

## 2023-07-15 ENCOUNTER — Ambulatory Visit: Payer: 59 | Admitting: Oncology

## 2023-07-15 ENCOUNTER — Other Ambulatory Visit: Payer: 59

## 2023-08-06 ENCOUNTER — Other Ambulatory Visit: Payer: Self-pay | Admitting: Oncology

## 2023-08-06 NOTE — Telephone Encounter (Signed)
CMP (Cancer Center only) Order: [id number] Status: Final result     Visible to patient: Yes (seen)     Next appt: 08/28/2023 at 02:30 PM in Oncology ([facility])     Dx: Essential thrombocythemia (HCC)   0 Result Notes          Component Ref Range & Units 2 mo ago (05/15/23) 3 mo ago (04/09/23) 7 mo ago (01/08/23) 10 mo ago (10/07/22) 1 yr ago (08/05/22) 1 yr ago (07/12/22) 1 yr ago (06/06/22)  Sodium 135 - 145 mmol/L 140 135 138 137 138 138 136  Potassium 3.5 - 5.1 mmol/L 4.0 3.6 3.3 Low  4.6 4.2 4.5 4.0  Chloride 98 - 111 mmol/L 109 107 109 106 107 110 110  CO2 22 - 32 mmol/L 24 22 23 23 24 24 22   Glucose, Bld 70 - 99 mg/dL 409 High  811 High  CM 107 High  CM 108 High  CM 100 High  CM 96 CM 104 High  CM  Comment: Glucose reference range applies only to samples taken after fasting for at least 8 hours.  BUN 8 - 23 mg/dL 15 18 18 18 12 15 17   Creatinine 0.44 - 1.00 mg/dL 9.14 7.82 9.56 2.13 High  0.80 0.77 0.86  Calcium 8.9 - 10.3 mg/dL 9.1 8.7 Low  8.8 Low  9.0 9.0 9.2 8.8 Low   Total Protein 6.5 - 8.1 g/dL 7.5 7.6 7.2 7.2 7.4 7.8 7.3  Albumin 3.5 - 5.0 g/dL 4.2 4.3 4.2 4.2 4.4 4.1 4.1  AST 15 - 41 U/L 18 19 19 21 18 21 17   ALT 0 - 44 U/L 17 18 19 19 21 22 17   Alkaline Phosphatase 38 - 126 U/L 47 44 42 46 49 43 48  Total Bilirubin 0.3 - 1.2 mg/dL 0.4 0.5 0.5 0.3 0.5 0.5 0.6  GFR, Estimated >60 mL/min >60 >60 CM       Comment: (NOTE) Calculated using the CKD-EPI Creatinine Equation (2021)  Anion gap 5 - 15 7 6  CM 6 CM 8 CM 7 CM 4 Low  CM 4 Low  CM  Comment: Performed at [facility], [address] [place], [place] [address]  Resulting Agency [facility]          Contains abnormal data CBC with Differential (Cancer Center Only) Order: [id number]           Component Ref Range & Units 2 mo ago (05/15/23) 3 mo ago (04/09/23) 7 mo ago (01/08/23) 10 mo ago (10/07/22) 11 mo  ago (09/02/22) 1 yr ago (08/05/22) 1 yr ago (07/12/22)  WBC Count 4.0 - 10.5 K/uL 6.1 4.7 3.9 Low  5.1 6.0 6.0 7.0  RBC 3.87 - 5.11 MIL/uL 3.60 Low  3.46 Low  3.49 Low  3.63 Low  4.12 4.24 4.39  Hemoglobin 12.0 - 15.0 g/dL 84.1 32.4 40.1 02.7 25.3 12.6 12.6  HCT 36.0 - 46.0 % 37.0 36.3 36.4 37.6 39.4 39.3 40.3  MCV 80.0 - 100.0 fL 102.8 High  104.9 High  104.3 High  103.6 High  95.6 92.7 91.8  MCH 26.0 - 34.0 pg 34.2 High  34.7 High  34.7 High  33.3 31.1 29.7 28.7  MCHC 30.0 - 36.0 g/dL 66.4 40.3 47.4 25.9 56.3 32.1 31.3  RDW 11.5 - 15.5 % 11.7 12.6 13.1 16.3 High  18.6 High  16.4 High  14.2  Platelet Count 150 - 400 K/uL 471 High  312 337 404 High  474 High  569 High  544 High   nRBC 0.0 - 0.2 % 0.0 0.0 0.0 0.0 0.0 0.0 0.0  Neutrophils Relative % % 56 50 43 51 51 52 64  Neutro Abs 1.7 - 7.7 K/uL 3.4 2.3 1.7 2.6 3.1 3.1 4.4  Lymphocytes Relative % 30 36 41 36 35 34 23  Lymphs Abs 0.7 - 4.0 K/uL 1.8 1.7 1.6 1.9 2.1 2.0 1.6  Monocytes Relative % 8 9 8 7 10 9 8   Monocytes Absolute 0.1 - 1.0 K/uL 0.5 0.4 0.3 0.4 0.6 0.5 0.5  Eosinophils Relative % 5 4 6 5 3 4 4   Eosinophils Absolute 0.0 - 0.5 K/uL 0.3 0.2 0.2 0.3 0.2 0.2 0.3  Basophils Relative % 1 1 2 1 1 1 1   Basophils Absolute 0.0 - 0.1 K/uL 0.0 0.1 0.1 0.1 0.1 0.1 0.1  Immature Granulocytes % 0 0 0 0 0 0 0  Abs Immature Granulocytes 0.00 - 0.07 K/uL 0.02 0.02 CM 0.01 CM 0.01 CM 0.02 CM 0.01 CM 0.03 CM  Comment: Performed at [facility], [address] [place], [place] [address]  Resulting Agency [facility]         Specimen Collected: 05/15/23 14:14 Last Resulted: 05/15/23 14:27

## 2023-08-28 ENCOUNTER — Inpatient Hospital Stay: Payer: 59 | Attending: Oncology

## 2023-08-28 ENCOUNTER — Inpatient Hospital Stay (HOSPITAL_BASED_OUTPATIENT_CLINIC_OR_DEPARTMENT_OTHER): Payer: 59 | Admitting: Oncology

## 2023-08-28 ENCOUNTER — Encounter: Payer: Self-pay | Admitting: Oncology

## 2023-08-28 VITALS — BP 130/77 | HR 62 | Temp 97.2°F | Resp 18 | Wt 163.9 lb

## 2023-08-28 DIAGNOSIS — I2699 Other pulmonary embolism without acute cor pulmonale: Secondary | ICD-10-CM | POA: Insufficient documentation

## 2023-08-28 DIAGNOSIS — D473 Essential (hemorrhagic) thrombocythemia: Secondary | ICD-10-CM

## 2023-08-28 LAB — CMP (CANCER CENTER ONLY)
ALT: 29 U/L (ref 0–44)
AST: 25 U/L (ref 15–41)
Albumin: 4.2 g/dL (ref 3.5–5.0)
Alkaline Phosphatase: 47 U/L (ref 38–126)
Anion gap: 5 (ref 5–15)
BUN: 16 mg/dL (ref 8–23)
CO2: 25 mmol/L (ref 22–32)
Calcium: 9.4 mg/dL (ref 8.9–10.3)
Chloride: 109 mmol/L (ref 98–111)
Creatinine: 0.79 mg/dL (ref 0.44–1.00)
GFR, Estimated: >60 mL/min (ref >60)
Glucose, Bld: 96 mg/dL (ref 70–99)
Potassium: 3.7 mmol/L (ref 3.5–5.1)
Sodium: 139 mmol/L (ref 135–145)
Total Bilirubin: 0.5 mg/dL (ref <1.2)
Total Protein: 7.7 g/dL (ref 6.5–8.1)

## 2023-08-28 LAB — CBC WITH DIFFERENTIAL (CANCER CENTER ONLY)
Abs Immature Granulocytes: 0.01 10*3/uL (ref 0.00–0.07)
Basophils Absolute: 0.1 10*3/uL (ref 0.0–0.1)
Basophils Relative: 1 %
Eosinophils Absolute: 0.2 10*3/uL (ref 0.0–0.5)
Eosinophils Relative: 4 %
HCT: 37.1 % (ref 36.0–46.0)
Hemoglobin: 12.3 g/dL (ref 12.0–15.0)
Immature Granulocytes: 0 %
Lymphocytes Relative: 34 %
Lymphs Abs: 1.7 10*3/uL (ref 0.7–4.0)
MCH: 34 pg (ref 26.0–34.0)
MCHC: 33.2 g/dL (ref 30.0–36.0)
MCV: 102.5 fL — ABNORMAL HIGH (ref 80.0–100.0)
Monocytes Absolute: 0.5 10*3/uL (ref 0.1–1.0)
Monocytes Relative: 9 %
Neutro Abs: 2.6 10*3/uL (ref 1.7–7.7)
Neutrophils Relative %: 52 %
Platelet Count: 320 10*3/uL (ref 150–400)
RBC: 3.62 MIL/uL — ABNORMAL LOW (ref 3.87–5.11)
RDW: 14.6 % (ref 11.5–15.5)
WBC Count: 5 10*3/uL (ref 4.0–10.5)
nRBC: 0 % (ref 0.0–0.2)

## 2023-08-28 MED ORDER — APIXABAN 2.5 MG PO TABS: 2.5000 mg | ORAL_TABLET | Freq: Two times a day (BID) | ORAL | 1 refills | Status: DC

## 2023-08-28 MED ORDER — HYDROXYUREA 500 MG PO CAPS: 500.0000 mg | ORAL_CAPSULE | ORAL | 0 refills | Status: DC

## 2023-08-28 NOTE — Assessment & Plan Note (Signed)
Recommend  Elqiuis 2.5mg  BID

## 2023-08-28 NOTE — Progress Notes (Signed)
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 Labs are reviewed and discussed with patient. Platelet counts slightly increased She can not tolerate Anagrelide. She prefers to get back on Hydroxyurea Recommend hydroxyurea 1000mg  x 1 day per week and 500mg  daily for rest of the week.  Refills were sent to pharmacy  Pulmonary embolism (HCC) Recommend  Elqiuis 2.5mg  BID   Orders Placed This Encounter  Procedures   CBC with Differential (Cancer Center Only)    Standing Status:   Future    Standing Expiration Date:   08/27/2024   CMP (Cancer Center only)    Standing Status:   Future    Standing Expiration Date:   08/27/2024   Follow-up  3 months lab MD cbc cmp   All questions were answered. The patient knows to call the clinic with any problems, questions or concerns.  Rickard Patience, MD, PhD [facility] Hematology Oncology 08/28/2023      CHIEF COMPLAINTS/REASON FOR VISIT:  thrombocytosis  HISTORY OF PRESENTING ILLNESS:   Robin Choi is a  62 y.o.  female with PMH listed below was seen in consultation at the request of  Jerl Mina, MD  for evaluation of thrombocytosis  For the past few months, patient reports lower abdominal pressure, dysuria symptoms.  She feels like "sitting on a ball"  patient has had evaluation by PCP and gynecology.  Patient reports frequent history of UTIs in the past. Patient had negative Pap smear, wet prep and ultrasound done by Dr. Lockie Pares. Dr. Dalbert Garnet reviewed the ultrasound results and images with patient.  No abnormal findings.   Her bimanual examination revealed cervical tenderness, uterine tenderness and minimal obturator tenderness and +bilateral pudendal nerves were tender.  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  12/11/2021, CBC showed a platelet count of.  804,000. 12/17/2021, platelet count 795,000 12/27/2021, platelet count 872,000.  Patient reports having life stressors.  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  She has to lift him multiple times in order to provide care.  She lives with her husband and brother-in-law.  Husband is on disability and not able to lift patient  Family history positive for father with carcinoma discovered in his neck, details unknown, breast cancer, colon cancer.She is overdue for colonoscopy.  She feels she could not get anyone to take care of brother-in-law during weekdays.   Oncology History  Essential thrombocythemia (HCC)  01/22/2022 Imaging   CT angio chest pulmonary embolism with or without contrast showed Small volume nonocclusive pulmonary embolism in a posterior basilar left lower lobe pulmonary artery branch.     01/22/2022 Imaging   Bilateral lower extremity ultrasound negative for DVT.    02/04/2022 Imaging   CT abdomen pelvis with contrast negative, no acute findings or other significant abnormality.   03/21/2022 Imaging   MRI abdomen with and without contrast showed no acute process or explanation for left upper quadrant pain.  Aortic atherosclerosis.   06/10/2022 Imaging   CT chest angiogram PE protocol  treatment no definite evidence of pulmonary embolism.  No definite abnormality seen in the chest.   06/14/2022 Initial Diagnosis   Essential thrombocythemia -  Peripheral blood JAK2 V617F mutation negative, with reflex to other mutations CALR, MPL, JAK 2 Ex 12-15 mutations negative.  [employer]  advanced NGS JAK2, MPL, CALR reflext to myeloid panel, negative,    bone marrow biopsy aspirate showed -  No immunophenotypic evidence of a lymphoproliferative disorder (no  monoclonal B cells or immunophenotypically abnormal T cells detected).  -  1% CD34 positive blasts  Bone marrow aspirate: Clot core -  Overall normocellular bone marrow with megakaryocytic hyperplasia with hyperlobulated/atypical forms with clustering  Morphologically there is a megakaryocytic hyperplasia with  hyper lobulation/hypersegmentation to the megakaryocytes in the absence  of a prominent panmyelosis.  Peripheral blood testing was negative for  the common 3 mutations seen in essential thrombocytosis and primary  myelofibrosis (JAK2, MPL and CALR).  While this could still be secondary to an underlying reactive cause the overall morphology favors a primary myeloproliferative neoplasm such as essential thrombocytosis or less likely prefibrotic primary myelofibrosis.    +chronic left upper quadrant pain.  She has had CT scan and MRI which did not explain her pain.  INTERVAL HISTORY [name] is a 62 y.o. female who has above history reviewed by me today presents for follow up visit for essential thrombocythemia.  Patient was here by herself.  Patient takes Eliquis 2.5 mg twice daily.   Denies any bleeding events. Multiple joint arthralgia as well as muscle pain were better after calcium supplementation.  Feels very tired, chronic problem for her.    Review of Systems  Constitutional:  Positive for fatigue. Negative for appetite change, chills and fever.  HENT:   Negative for hearing loss and voice change.   Eyes:  Negative for eye problems.  Respiratory:  Negative for chest tightness and cough.   Cardiovascular:  Negative for chest pain.  Gastrointestinal:  Negative for abdominal distention and blood in stool.  Endocrine: Negative for hot flashes.  Genitourinary:  Negative for difficulty urinating  and frequency.   Musculoskeletal:  Positive for arthralgias.  Skin:  Negative for itching and rash.  Neurological:  Negative for dizziness, extremity weakness and headaches.  Hematological:  Negative for adenopathy.  Psychiatric/Behavioral:  Negative for confusion.     MEDICAL HISTORY:  Past Medical History:  Diagnosis Date   GERD (gastroesophageal reflux disease)    Hypothyroidism     SURGICAL HISTORY: Past Surgical History:  Procedure Laterality Date   CARDIAC CATHETERIZATION     COLONOSCOPY WITH PROPOFOL N/A 01/15/2022   Procedure: COLONOSCOPY WITH PROPOFOL;  Surgeon: Wyline Mood, MD;  Location: [facility] ENDOSCOPY;  Service: Gastroenterology;  Laterality: N/A;  Needs an early appointment.  Has disabled brother and easier to get sitter early in the morning.   ESOPHAGOGASTRODUODENOSCOPY (EGD) WITH PROPOFOL N/A 09/04/2022   Procedure: ESOPHAGOGASTRODUODENOSCOPY (EGD) WITH PROPOFOL;  Surgeon: Wyline Mood, MD;  Location: [facility] ENDOSCOPY;  Service: Gastroenterology;  Laterality: N/A;   NOVASURE ABLATION     OTHER SURGICAL HISTORY     "tubal cauterization"    SOCIAL HISTORY: Social History   Socioeconomic History   Marital status: Married    Spouse name: Not on file   Number of children: Not on file   Years of education: Not on file   Highest education level: Not on file  Occupational History   Not on file  Tobacco Use   Smoking status: Former  Current packs/day: 0.00    Types: Cigarettes    Quit date: 1980    Years since quitting: 44.9   Smokeless tobacco: Never   Tobacco comments:    Quit 40 years ago  Vaping Use   Vaping status: Never Used  Substance and Sexual Activity   Alcohol use: Not Currently    Comment: occational beer   Drug use: Never   Sexual activity: Not Currently  Other Topics Concern   Not on file  Social History Narrative   Not on file   Social Determinants of Health   Financial Resource Strain: Patient Declined (07/03/2023)   Received from  [facility]   Overall Financial Resource Strain (CARDIA)    Difficulty of Paying Living Expenses: Patient declined  Food Insecurity: Patient Declined (07/03/2023)   Received from [facility]   Hunger Vital Sign    Worried About Running Out of Food in the Last Year: Patient declined    Ran Out of Food in the Last Year: Patient declined  Transportation Needs: Patient Declined (07/03/2023)   Received from [facility]   PRAPARE - Transportation    In the past 12 months, has lack of transportation kept you from medical appointments or from getting medications?: Patient declined    Lack of Transportation (Non-Medical): Patient declined  Physical Activity: Not on file  Stress: Not on file  Social Connections: Not on file  Intimate Partner Violence: Not on file    FAMILY HISTORY: Family History  Problem Relation Age of Onset   Diabetes Mother    Colon cancer Mother    Breast cancer Mother    Cancer Father        carcinoma   Bone cancer Brother    Diabetes Maternal Grandmother     ALLERGIES:  is allergic to celecoxib, nitrofurantoin macrocrystal, nitrofurantoin, and sulfa antibiotics.  MEDICATIONS:  Current Outpatient Medications  Medication Sig Dispense Refill   almotriptan (AXERT) 12.5 MG tablet      ARMOUR THYROID 60 MG tablet Take 60 mg by mouth daily.     cetirizine (ZYRTEC) 10 MG tablet Take by mouth.     clobetasol cream (TEMOVATE) 0.05 % Apply to affected areas hands 1-2 times a day until rash improved. Avoid face, groin, underarms. 30 g 1   ergocalciferol (VITAMIN D2) 1.25 MG (50000 UT) capsule Take 50,000 Units by mouth once a week.     ketoconazole (NIZORAL) 2 % cream Apply to affected areas body folds 1-2 times a day as needed until rash improved. 60 g 2   LANSOPRAZOLE PO Take by mouth.     topiramate (TOPAMAX) 50 MG tablet Take 50 mg by mouth 2 (two) times daily.     valACYclovir (VALTREX) 1000 MG tablet       apixaban (ELIQUIS) 2.5 MG TABS tablet Take 1 tablet (2.5 mg total) by mouth 2 (two) times daily. 180 tablet 1   hydroxyurea (HYDREA) 500 MG capsule Take 1 capsule (500 mg total) by mouth See admin instructions. May take with food to minimize GI side effects. Take hydroxyurea 1000mg  for 1 day per week and 500mg  daily for rest of the week. 102 capsule 0   potassium chloride SA (KLOR-CON M) 20 MEQ tablet Take 1 tablet (20 mEq total) by mouth daily. (Patient not taking: Reported on 04/09/2023) 3 tablet 0   No current facility-administered medications for this visit.     PHYSICAL EXAMINATION: ECOG PERFORMANCE STATUS: 1 - Symptomatic but completely  ambulatory Vitals:   08/28/23 1414  BP: 130/77  Pulse: 62  Resp: 18  Temp: (!) 97.2 F (36.2 C)  SpO2: 100%   Filed Weights   08/28/23 1414  Weight: 163 lb 14.4 oz (74.3 kg)    Physical Exam Constitutional:      General: She is not in acute distress. HENT:     Head: Normocephalic and atraumatic.  Eyes:     General: No scleral icterus. Cardiovascular:     Rate and Rhythm: Normal rate.  Pulmonary:     Effort: Pulmonary effort is normal. No respiratory distress.  Abdominal:     General: There is no distension.  Musculoskeletal:        General: No deformity. Normal range of motion.     Cervical back: Normal range of motion and neck supple.  Skin:    General: Skin is warm and dry.     Findings: No erythema or rash.  Neurological:     Mental Status: She is alert and oriented to person, place, and time. Mental status is at baseline.  Psychiatric:        Mood and Affect: Mood normal.     LABORATORY DATA:  I have reviewed the data as listed    Latest Ref Rng & Units 08/28/2023    2:07 PM 05/15/2023    2:14 PM 04/09/2023    2:25 PM  CBC  WBC 4.0 - 10.5 K/uL 5.0  6.1  4.7   Hemoglobin 12.0 - 15.0 g/dL 82.9  56.2  13.0   Hematocrit 36.0 - 46.0 % 37.1  37.0  36.3   Platelets 150 - 400 K/uL 320  471  312       Latest Ref Rng & Units  08/28/2023    2:07 PM 05/15/2023    2:13 PM 04/09/2023    2:25 PM  CMP  Glucose 70 - 99 mg/dL 96  865  784   BUN 8 - 23 mg/dL 16  15  18    Creatinine 0.44 - 1.00 mg/dL 6.96  2.95  2.84   Sodium 135 - 145 mmol/L 139  140  135   Potassium 3.5 - 5.1 mmol/L 3.7  4.0  3.6   Chloride 98 - 111 mmol/L 109  109  107   CO2 22 - 32 mmol/L 25  24  22    Calcium 8.9 - 10.3 mg/dL 9.4  9.1  8.7   Total Protein 6.5 - 8.1 g/dL 7.7  7.5  7.6   Total Bilirubin <1.2 mg/dL 0.5  0.4  0.5   Alkaline Phos 38 - 126 U/L 47  47  44   AST 15 - 41 U/L 25  18  19    ALT 0 - 44 U/L 29  17  18      Iron/TIBC/Ferritin/ %Sat    Component Value Date/Time   IRON 62 05/16/2022 1446   TIBC 288 05/16/2022 1446   FERRITIN 76 05/16/2022 1448   IRONPCTSAT 22 05/16/2022 1446      RADIOGRAPHIC STUDIES: I have personally reviewed the radiological images as listed and agreed with the findings in the report. No results found.

## 2023-08-28 NOTE — Assessment & Plan Note (Signed)
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 Labs are reviewed and discussed with patient. Platelet counts slightly increased She can not tolerate Anagrelide. She prefers to get back on Hydroxyurea Recommend hydroxyurea 1000mg  x 1 day per week and 500mg  daily for rest of the week. Refills were sent to pharmacy

## 2023-12-03 ENCOUNTER — Inpatient Hospital Stay: Payer: 59 | Attending: Oncology

## 2023-12-03 ENCOUNTER — Encounter: Payer: Self-pay | Admitting: Oncology

## 2023-12-03 ENCOUNTER — Inpatient Hospital Stay (HOSPITAL_BASED_OUTPATIENT_CLINIC_OR_DEPARTMENT_OTHER): Payer: 59 | Admitting: Oncology

## 2023-12-03 VITALS — BP 129/70 | HR 65 | Temp 99.1°F | Resp 18 | Wt 163.8 lb

## 2023-12-03 DIAGNOSIS — R7989 Other specified abnormal findings of blood chemistry: Secondary | ICD-10-CM | POA: Insufficient documentation

## 2023-12-03 DIAGNOSIS — I2699 Other pulmonary embolism without acute cor pulmonale: Secondary | ICD-10-CM | POA: Diagnosis not present

## 2023-12-03 DIAGNOSIS — D473 Essential (hemorrhagic) thrombocythemia: Secondary | ICD-10-CM | POA: Diagnosis present

## 2023-12-03 DIAGNOSIS — R110 Nausea: Secondary | ICD-10-CM | POA: Diagnosis not present

## 2023-12-03 DIAGNOSIS — Z7901 Long term (current) use of anticoagulants: Secondary | ICD-10-CM | POA: Insufficient documentation

## 2023-12-03 LAB — CMP (CANCER CENTER ONLY)
ALT: 23 U/L (ref 0–44)
AST: 18 U/L (ref 15–41)
Albumin: 4.3 g/dL (ref 3.5–5.0)
Alkaline Phosphatase: 45 U/L (ref 38–126)
Anion gap: 7 (ref 5–15)
BUN: 21 mg/dL (ref 8–23)
CO2: 23 mmol/L (ref 22–32)
Calcium: 9.3 mg/dL (ref 8.9–10.3)
Chloride: 106 mmol/L (ref 98–111)
Creatinine: 1.03 mg/dL — ABNORMAL HIGH (ref 0.44–1.00)
GFR, Estimated: >60 mL/min (ref >60)
Glucose, Bld: 105 mg/dL — ABNORMAL HIGH (ref 70–99)
Potassium: 3.8 mmol/L (ref 3.5–5.1)
Sodium: 136 mmol/L (ref 135–145)
Total Bilirubin: 0.6 mg/dL (ref 0.0–1.2)
Total Protein: 7.4 g/dL (ref 6.5–8.1)

## 2023-12-03 LAB — CBC WITH DIFFERENTIAL (CANCER CENTER ONLY)
Abs Immature Granulocytes: 0.02 10*3/uL (ref 0.00–0.07)
Basophils Absolute: 0.1 10*3/uL (ref 0.0–0.1)
Basophils Relative: 1 %
Eosinophils Absolute: 0.2 10*3/uL (ref 0.0–0.5)
Eosinophils Relative: 4 %
HCT: 37.7 % (ref 36.0–46.0)
Hemoglobin: 12.6 g/dL (ref 12.0–15.0)
Immature Granulocytes: 0 %
Lymphocytes Relative: 39 %
Lymphs Abs: 1.7 10*3/uL (ref 0.7–4.0)
MCH: 35.3 pg — ABNORMAL HIGH (ref 26.0–34.0)
MCHC: 33.4 g/dL (ref 30.0–36.0)
MCV: 105.6 fL — ABNORMAL HIGH (ref 80.0–100.0)
Monocytes Absolute: 0.5 10*3/uL (ref 0.1–1.0)
Monocytes Relative: 12 %
Neutro Abs: 2 10*3/uL (ref 1.7–7.7)
Neutrophils Relative %: 44 %
Platelet Count: 306 10*3/uL (ref 150–400)
RBC: 3.57 MIL/uL — ABNORMAL LOW (ref 3.87–5.11)
RDW: 12.6 % (ref 11.5–15.5)
WBC Count: 4.5 10*3/uL (ref 4.0–10.5)
nRBC: 0 % (ref 0.0–0.2)

## 2023-12-03 MED ORDER — ONDANSETRON HCL 4 MG PO TABS: 4.0000 mg | ORAL_TABLET | Freq: Three times a day (TID) | ORAL | 2 refills | Status: AC | PRN

## 2023-12-03 MED ORDER — HYDROXYUREA 500 MG PO CAPS: 500.0000 mg | ORAL_CAPSULE | ORAL | 2 refills | Status: DC

## 2023-12-03 NOTE — Assessment & Plan Note (Signed)
 Intermittent symptoms.  Recommend Zofran as needed as directed.  Prescription sent to pharmacy.

## 2023-12-03 NOTE — Assessment & Plan Note (Addendum)
 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 Labs are reviewed and discussed with patient.  Stable counts. Continue  hydroxyurea 1000mg  x 1 day per week and 500mg  daily for rest of the week.  Refills were sent to pharmacy

## 2023-12-03 NOTE — Progress Notes (Signed)
 Pt here for follow up. Pt requesting refill on Eliquis and Hydrea.

## 2023-12-03 NOTE — Assessment & Plan Note (Signed)
 Continue Elqiuis 2.5mg  BID

## 2023-12-03 NOTE — Progress Notes (Signed)
 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 Labs are reviewed and discussed with patient.  Stable counts. Continue  hydroxyurea 1000mg  x 1 day per week and 500mg  daily for rest of the week.  Refills were sent to pharmacy  Pulmonary embolism (HCC) Continue Elqiuis 2.5mg  BID  Nausea without vomiting Intermittent symptoms.  Recommend Zofran as needed as directed.  Prescription sent to pharmacy.  Elevated serum creatinine Encourage patient to increase oral hydration.   Orders Placed This Encounter  Procedures   CBC with Differential (Cancer Center Only)    Standing Status:   Future    Expected Date:   03/04/2024    Expiration Date:   12/02/2024   CMP (Cancer Center only)    Standing Status:   Future    Expected Date:   03/04/2024    Expiration Date:   12/02/2024   Follow-up  3 months lab MD cbc cmp   All questions were answered. The patient knows to call the clinic with any problems, questions or concerns.  Rickard Patience, MD, PhD [facility] Hematology Oncology 12/03/2023      CHIEF COMPLAINTS/REASON FOR VISIT:  thrombocytosis  HISTORY OF PRESENTING ILLNESS:   Robin Choi is a  63 y.o.  female with PMH listed below was seen in consultation at the request of  Jerl Mina, MD  for evaluation of thrombocytosis  For the past few months, patient reports lower abdominal pressure, dysuria symptoms.  She feels like "sitting on a ball"  patient has had evaluation by PCP and gynecology.  Patient reports frequent history of UTIs in the past. Patient had negative  Pap smear, wet prep and ultrasound done by Dr. Lockie Pares. Dr. Dalbert Garnet reviewed the ultrasound results and images with patient.  No abnormal findings.  Her bimanual examination revealed cervical tenderness, uterine tenderness and minimal obturator tenderness and +bilateral pudendal nerves were tender.  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  12/11/2021, CBC showed a platelet count of.  804,000. 12/17/2021, platelet count 795,000 12/27/2021, platelet count 872,000.  Patient reports having life stressors.  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  She has to lift him multiple times in order to provide care.  She lives with her husband and brother-in-law.  Husband is on disability and not able to lift patient  Family history positive for father with carcinoma discovered in his neck, details unknown, breast cancer, colon cancer.She is overdue for colonoscopy.  She feels she could not get anyone to take care of brother-in-law during weekdays.   Oncology History  Essential thrombocythemia (HCC)  01/22/2022 Imaging   CT angio chest pulmonary embolism with or without contrast showed Small volume nonocclusive pulmonary embolism in a posterior basilar left lower lobe pulmonary artery branch.     01/22/2022 Imaging   Bilateral lower extremity ultrasound negative for DVT.    02/04/2022 Imaging   CT abdomen pelvis with contrast negative, no acute findings or other significant abnormality.   03/21/2022 Imaging   MRI abdomen  with and without contrast showed no acute process or explanation for left upper quadrant pain.  Aortic atherosclerosis.   06/10/2022 Imaging   CT chest angiogram PE protocol treatment no definite evidence of pulmonary embolism.  No definite abnormality seen in the chest.   06/14/2022 Initial Diagnosis   Essential  thrombocythemia -  Peripheral blood JAK2 V617F mutation negative, with reflex to other mutations CALR, MPL, JAK 2 Ex 12-15 mutations negative.  Onkosight advanced NGS JAK2, MPL, CALR reflext to myeloid panel, negative,    bone marrow biopsy aspirate showed -  No immunophenotypic evidence of a lymphoproliferative disorder (no  monoclonal B cells or immunophenotypically abnormal T cells detected).  -  1% CD34 positive blasts  Bone marrow aspirate: Clot core -  Overall normocellular bone marrow with megakaryocytic hyperplasia with hyperlobulated/atypical forms with clustering  Morphologically there is a megakaryocytic hyperplasia with  hyper lobulation/hypersegmentation to the megakaryocytes in the absence  of a prominent panmyelosis.  Peripheral blood testing was negative for  the common 3 mutations seen in essential thrombocytosis and primary  myelofibrosis (JAK2, MPL and CALR).  While this could still be secondary to an underlying reactive cause the overall morphology favors a primary myeloproliferative neoplasm such as essential thrombocytosis or less likely prefibrotic primary myelofibrosis.    +chronic left upper quadrant pain.  She has had CT scan and MRI which did not explain her pain.  INTERVAL HISTORY Robin Choi is a 63 y.o. female who has above history reviewed by me today presents for follow up visit for essential thrombocythemia.  Patient was here by herself.  Patient takes Eliquis 2.5 mg twice daily.   Denies any bleeding events. Multiple joint arthralgia as well as muscle pain were better after calcium supplementation.  She continues to have chronic fatigue and joint stiffness.  She tolerates hydroxyurea   Review of Systems  Constitutional:  Positive for fatigue. Negative for appetite change, chills and fever.  HENT:   Negative for hearing loss and voice change.   Eyes:  Negative for eye problems.  Respiratory:  Negative for chest tightness and cough.    Cardiovascular:  Negative for chest pain.  Gastrointestinal:  Negative for abdominal distention and blood in stool.  Endocrine: Negative for hot flashes.  Genitourinary:  Negative for difficulty urinating and frequency.   Musculoskeletal:  Positive for arthralgias.  Skin:  Negative for itching and rash.  Neurological:  Negative for dizziness, extremity weakness and headaches.  Hematological:  Negative for adenopathy.  Psychiatric/Behavioral:  Negative for confusion.     MEDICAL HISTORY:  Past Medical History:  Diagnosis Date   GERD (gastroesophageal reflux disease)    Hypothyroidism     SURGICAL HISTORY: Past Surgical History:  Procedure Laterality Date   CARDIAC CATHETERIZATION     COLONOSCOPY WITH PROPOFOL N/A 01/15/2022   Procedure: COLONOSCOPY WITH PROPOFOL;  Surgeon: Wyline Mood, MD;  Location: [facility] ENDOSCOPY;  Service: Gastroenterology;  Laterality: N/A;  Needs an early appointment.  Has disabled brother and easier to get sitter early in the morning.   ESOPHAGOGASTRODUODENOSCOPY (EGD) WITH PROPOFOL N/A 09/04/2022   Procedure: ESOPHAGOGASTRODUODENOSCOPY (EGD) WITH PROPOFOL;  Surgeon: Wyline Mood, MD;  Location: [facility] ENDOSCOPY;  Service: Gastroenterology;  Laterality: N/A;   NOVASURE ABLATION     OTHER SURGICAL HISTORY     "tubal cauterization"    SOCIAL HISTORY: Social History   Socioeconomic History   Marital status: Married    Spouse name: Not on file   Number of children: Not on file  Years of education: Not on file   Highest education level: Not on file  Occupational History   Not on file  Tobacco Use   Smoking status: Former    Current packs/day: 0.00    Types: Cigarettes    Quit date: 51    Years since quitting: 45.2   Smokeless tobacco: Never   Tobacco comments:    Quit 40 years ago  Vaping Use   Vaping status: Never Used  Substance and Sexual Activity   Alcohol use: Not Currently    Comment: occational beer   Drug use: Never   Sexual  activity: Not Currently  Other Topics Concern   Not on file  Social History Narrative   Not on file   Social Drivers of Health   Financial Resource Strain: Patient Declined (10/04/2023)   Received from [facility]   Overall Financial Resource Strain (CARDIA)    Difficulty of Paying Living Expenses: Patient declined  Food Insecurity: Patient Declined (10/04/2023)   Received from [facility]   Hunger Vital Sign    Worried About Running Out of Food in the Last Year: Patient declined    Ran Out of Food in the Last Year: Patient declined  Transportation Needs: Patient Declined (10/04/2023)   Received from [facility]   PRAPARE - Transportation    In the past 12 months, has lack of transportation kept you from medical appointments or from getting medications?: Patient declined    Lack of Transportation (Non-Medical): Patient declined  Physical Activity: Not on file  Stress: Not on file  Social Connections: Not on file  Intimate Partner Violence: Not on file    FAMILY HISTORY: Family History  Problem Relation Age of Onset   Diabetes Mother    Colon cancer Mother    Breast cancer Mother    Cancer Father        carcinoma   Bone cancer Brother    Diabetes Maternal Grandmother     ALLERGIES:  is allergic to celecoxib, nitrofurantoin macrocrystal, nitrofurantoin, and sulfa antibiotics.  MEDICATIONS:  Current Outpatient Medications  Medication Sig Dispense Refill   apixaban (ELIQUIS) 2.5 MG TABS tablet Take 1 tablet (2.5 mg total) by mouth 2 (two) times daily. 180 tablet 1   ARMOUR THYROID 60 MG tablet Take 60 mg by mouth daily.     cetirizine (ZYRTEC) 10 MG tablet Take by mouth.     clobetasol cream (TEMOVATE) 0.05 % Apply to affected areas hands 1-2 times a day until rash improved. Avoid face, groin, underarms. 30 g 1   ergocalciferol (VITAMIN D2) 1.25 MG (50000 UT) capsule Take 50,000 Units by mouth once a week.      LANSOPRAZOLE PO Take by mouth.     ondansetron (ZOFRAN) 4 MG tablet Take 1 tablet (4 mg total) by mouth every 8 (eight) hours as needed for nausea or vomiting. 60 tablet 2   topiramate (TOPAMAX) 50 MG tablet Take 50 mg by mouth 2 (two) times daily.     valACYclovir (VALTREX) 1000 MG tablet      almotriptan (AXERT) 12.5 MG tablet  (Patient not taking: Reported on 12/03/2023)     hydroxyurea (HYDREA) 500 MG capsule Take 1 capsule (500 mg total) by mouth See admin instructions. May take with food to minimize GI side effects. Take hydroxyurea 1000mg  for 1 day per week and 500mg  daily for rest of the week. 102 capsule 2   No current facility-administered medications for this visit.  PHYSICAL EXAMINATION: ECOG PERFORMANCE STATUS: 1 - Symptomatic but completely ambulatory Vitals:   12/03/23 1408  BP: 129/70  Pulse: 65  Resp: 18  Temp: 99.1 F (37.3 C)   Filed Weights   12/03/23 1408  Weight: 163 lb 12.8 oz (74.3 kg)    Physical Exam Constitutional:      General: She is not in acute distress. HENT:     Head: Normocephalic and atraumatic.  Eyes:     General: No scleral icterus. Cardiovascular:     Rate and Rhythm: Normal rate.  Pulmonary:     Effort: Pulmonary effort is normal. No respiratory distress.  Abdominal:     General: There is no distension.  Musculoskeletal:        General: No deformity. Normal range of motion.     Cervical back: Normal range of motion and neck supple.  Skin:    General: Skin is warm and dry.     Findings: No erythema or rash.  Neurological:     Mental Status: She is alert and oriented to person, place, and time. Mental status is at baseline.  Psychiatric:        Mood and Affect: Mood normal.     LABORATORY DATA:  I have reviewed the data as listed    Latest Ref Rng & Units 12/03/2023    1:50 PM 08/28/2023    2:07 PM 05/15/2023    2:14 PM  CBC  WBC 4.0 - 10.5 K/uL 4.5  5.0  6.1   Hemoglobin 12.0 - 15.0 g/dL 40.9  81.1  91.4   Hematocrit  36.0 - 46.0 % 37.7  37.1  37.0   Platelets 150 - 400 K/uL 306  320  471       Latest Ref Rng & Units 12/03/2023    1:50 PM 08/28/2023    2:07 PM 05/15/2023    2:13 PM  CMP  Glucose 70 - 99 mg/dL 782  96  956   BUN 8 - 23 mg/dL 21  16  15    Creatinine 0.44 - 1.00 mg/dL 2.13  0.86  5.78   Sodium 135 - 145 mmol/L 136  139  140   Potassium 3.5 - 5.1 mmol/L 3.8  3.7  4.0   Chloride 98 - 111 mmol/L 106  109  109   CO2 22 - 32 mmol/L 23  25  24    Calcium 8.9 - 10.3 mg/dL 9.3  9.4  9.1   Total Protein 6.5 - 8.1 g/dL 7.4  7.7  7.5   Total Bilirubin 0.0 - 1.2 mg/dL 0.6  0.5  0.4   Alkaline Phos 38 - 126 U/L 45  47  47   AST 15 - 41 U/L 18  25  18    ALT 0 - 44 U/L 23  29  17      Iron/TIBC/Ferritin/ %Sat    Component Value Date/Time   IRON 62 05/16/2022 1446   TIBC 288 05/16/2022 1446   FERRITIN 76 05/16/2022 1448   IRONPCTSAT 22 05/16/2022 1446      RADIOGRAPHIC STUDIES: I have personally reviewed the radiological images as listed and agreed with the findings in the report. No results found.

## 2023-12-03 NOTE — Assessment & Plan Note (Signed)
Encourage patient to increase oral hydration.

## 2024-01-14 ENCOUNTER — Telehealth: Payer: Self-pay | Admitting: Hematology and Oncology

## 2024-01-15 ENCOUNTER — Inpatient Hospital Stay

## 2024-01-15 ENCOUNTER — Other Ambulatory Visit: Payer: Self-pay | Admitting: *Deleted

## 2024-01-15 ENCOUNTER — Other Ambulatory Visit: Payer: Self-pay | Admitting: Hematology and Oncology

## 2024-01-15 ENCOUNTER — Inpatient Hospital Stay: Attending: Oncology | Admitting: Hematology and Oncology

## 2024-01-15 VITALS — BP 134/64 | HR 64 | Temp 97.9°F | Resp 13 | Wt 166.4 lb

## 2024-01-15 DIAGNOSIS — I2699 Other pulmonary embolism without acute cor pulmonale: Secondary | ICD-10-CM | POA: Insufficient documentation

## 2024-01-15 DIAGNOSIS — E039 Hypothyroidism, unspecified: Secondary | ICD-10-CM | POA: Insufficient documentation

## 2024-01-15 DIAGNOSIS — D473 Essential (hemorrhagic) thrombocythemia: Secondary | ICD-10-CM

## 2024-01-15 DIAGNOSIS — Z87891 Personal history of nicotine dependence: Secondary | ICD-10-CM | POA: Diagnosis not present

## 2024-01-15 DIAGNOSIS — Z79899 Other long term (current) drug therapy: Secondary | ICD-10-CM | POA: Diagnosis not present

## 2024-01-15 DIAGNOSIS — R110 Nausea: Secondary | ICD-10-CM | POA: Diagnosis not present

## 2024-01-15 LAB — CMP (CANCER CENTER ONLY)
ALT: 17 U/L (ref 0–44)
AST: 16 U/L (ref 15–41)
Albumin: 4.3 g/dL (ref 3.5–5.0)
Alkaline Phosphatase: 45 U/L (ref 38–126)
Anion gap: 6 (ref 5–15)
BUN: 18 mg/dL (ref 8–23)
CO2: 24 mmol/L (ref 22–32)
Calcium: 9.1 mg/dL (ref 8.9–10.3)
Chloride: 110 mmol/L (ref 98–111)
Creatinine: 0.98 mg/dL (ref 0.44–1.00)
GFR, Estimated: >60 mL/min (ref >60)
Glucose, Bld: 98 mg/dL (ref 70–99)
Potassium: 4.2 mmol/L (ref 3.5–5.1)
Sodium: 140 mmol/L (ref 135–145)
Total Bilirubin: 0.4 mg/dL (ref 0.0–1.2)
Total Protein: 7 g/dL (ref 6.5–8.1)

## 2024-01-15 LAB — CBC WITH DIFFERENTIAL (CANCER CENTER ONLY)
Abs Immature Granulocytes: 0.01 10*3/uL (ref 0.00–0.07)
Basophils Absolute: 0 10*3/uL (ref 0.0–0.1)
Basophils Relative: 1 %
Eosinophils Absolute: 0.2 10*3/uL (ref 0.0–0.5)
Eosinophils Relative: 4 %
HCT: 35.5 % — ABNORMAL LOW (ref 36.0–46.0)
Hemoglobin: 12 g/dL (ref 12.0–15.0)
Immature Granulocytes: 0 %
Lymphocytes Relative: 34 %
Lymphs Abs: 1.4 10*3/uL (ref 0.7–4.0)
MCH: 35.3 pg — ABNORMAL HIGH (ref 26.0–34.0)
MCHC: 33.8 g/dL (ref 30.0–36.0)
MCV: 104.4 fL — ABNORMAL HIGH (ref 80.0–100.0)
Monocytes Absolute: 0.4 10*3/uL (ref 0.1–1.0)
Monocytes Relative: 9 %
Neutro Abs: 2.2 10*3/uL (ref 1.7–7.7)
Neutrophils Relative %: 52 %
Platelet Count: 270 10*3/uL (ref 150–400)
RBC: 3.4 MIL/uL — ABNORMAL LOW (ref 3.87–5.11)
RDW: 13 % (ref 11.5–15.5)
WBC Count: 4.2 10*3/uL (ref 4.0–10.5)
nRBC: 0 % (ref 0.0–0.2)

## 2024-01-15 NOTE — Progress Notes (Signed)
 [facility] [phone]) [phone]   [phone]  PROGRESS NOTE  Patient Care Team: Lyle San, MD as PCP - General (Family Medicine) Delfina Feller, Ernestine Heads, MD (Family Medicine) Timmy Forbes, MD as Consulting Physician (Oncology)  Hematological/Oncological History # Essential Thrombocytosis, Triple Negative  05/16/2022: Was seen by Dr. Rosaline Coma for second opinion.  12/03/2023: last visit with Dr. Wilhelmenia Harada  01/15/2024: 1000 mg hydroxyurea  on Wednesday, 500 mg PO daily every other day of the week.  01/15/2024: transfer care to Dr. Rosaline Coma   Interval History:  Robin Choi 63 y.o. 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 The patient's last visit was on 12/03/2023 with Dr. Wilhelmenia Harada. In the interim since the last visit she has continued on hydroxyurea  but desires to transfer care to [facility].  On exam today Robin Choi is accompanied by her husband.  She reports she would like to transition her care to the [facility].  She reports that when she met with us  for the second opinion she connected with our center better.  She reports that she has been feeling fatigue and "muscle sleep all the time".  She had normal thyroid  testing.  She reports that she is tolerating the hydroxyurea  therapy otherwise well with no nausea, vomiting, or diarrhea.  She continues taking Eliquis  2.5 mg twice daily without any bleeding, bruising, or dark stools.  She reports that she is currently on her husband's insurance to disability but is concerned that may change in the near future.  Other than marked fatigue she is not having any other focal symptoms at this time.  A full 10 point ROS is otherwise negative.  MEDICAL HISTORY:  Past Medical History:  Diagnosis Date   GERD (gastroesophageal reflux disease)    Hypothyroidism     SURGICAL HISTORY: Past Surgical History:  Procedure Laterality Date   CARDIAC CATHETERIZATION      COLONOSCOPY WITH PROPOFOL  N/A 01/15/2022   Procedure: COLONOSCOPY WITH PROPOFOL ;  Surgeon: Luke Salaam, MD;  Location: [facility] ENDOSCOPY;  Service: Gastroenterology;  Laterality: N/A;  Needs an early appointment.  Has disabled brother and easier to get sitter early in the morning.   ESOPHAGOGASTRODUODENOSCOPY (EGD) WITH PROPOFOL  N/A 09/04/2022   Procedure: ESOPHAGOGASTRODUODENOSCOPY (EGD) WITH PROPOFOL ;  Surgeon: Luke Salaam, MD;  Location: [facility] ENDOSCOPY;  Service: Gastroenterology;  Laterality: N/A;   NOVASURE ABLATION     OTHER SURGICAL HISTORY     "tubal cauterization"    SOCIAL HISTORY: Social History   Socioeconomic History   Marital status: Married    Spouse name: Not on file   Number of children: Not on file   Years of education: Not on file   Highest education level: Not on file  Occupational History   Not on file  Tobacco Use   Smoking status: Former    Current packs/day: 0.00    Types: Cigarettes    Quit date: 1980    Years since quitting: 45.3   Smokeless tobacco: Never   Tobacco comments:    Quit 40 years ago  Vaping Use   Vaping status: Never Used  Substance and Sexual Activity   Alcohol use: Not Currently    Comment: occational beer   Drug use: Never   Sexual activity: Not Currently  Other Topics Concern   Not on file  Social History Narrative   Not on file   Social Drivers of Health   Financial Resource Strain: Patient Declined (  10/04/2023)   Received from [facility]   Overall Financial Resource Strain (CARDIA)    Difficulty of Paying Living Expenses: Patient declined  Food Insecurity: Patient Declined (10/04/2023)   Received from [facility]   Hunger Vital Sign    Worried About Running Out of Food in the Last Year: Patient declined    Ran Out of Food in the Last Year: Patient declined  Transportation Needs: Patient Declined (10/04/2023)   Received from [facility] - Transportation     In the past 12 months, has lack of transportation kept you from medical appointments or from getting medications?: Patient declined    Lack of Transportation (Non-Medical): Patient declined  Physical Activity: Not on file  Stress: Not on file  Social Connections: Not on file  Intimate Partner Violence: Not on file    FAMILY HISTORY: Family History  Problem Relation Age of Onset   Diabetes Mother    Colon cancer Mother    Breast cancer Mother    Cancer Father        carcinoma   Bone cancer Brother    Diabetes Maternal Grandmother     ALLERGIES:  is allergic to celecoxib, nitrofurantoin macrocrystal, nitrofurantoin, and sulfa antibiotics.  MEDICATIONS:  Current Outpatient Medications  Medication Sig Dispense Refill   almotriptan (AXERT) 12.5 MG tablet  (Patient not taking: Reported on 12/03/2023)     apixaban  (ELIQUIS ) 2.5 MG TABS tablet Take 1 tablet (2.5 mg total) by mouth 2 (two) times daily. 180 tablet 1   ARMOUR THYROID  90 MG tablet Take 90 mg by mouth daily.     cetirizine (ZYRTEC) 10 MG tablet Take by mouth.     clobetasol  cream (TEMOVATE ) 0.05 % Apply to affected areas hands 1-2 times a day until rash improved. Avoid face, groin, underarms. 30 g 1   ergocalciferol (VITAMIN D2) 1.25 MG (50000 UT) capsule Take 50,000 Units by mouth once a week.     hydroxyurea  (HYDREA ) 500 MG capsule Take 1 capsule (500 mg total) by mouth See admin instructions. May take with food to minimize GI side effects. Take hydroxyurea  1000mg  for 1 day per week and 500mg  daily for rest of the week. 102 capsule 2   LANSOPRAZOLE PO Take by mouth.     ondansetron  (ZOFRAN ) 4 MG tablet Take 1 tablet (4 mg total) by mouth every 8 (eight) hours as needed for nausea or vomiting. 60 tablet 2   topiramate (TOPAMAX) 50 MG tablet Take 50 mg by mouth 2 (two) times daily.     valACYclovir (VALTREX) 1000 MG tablet      No current facility-administered medications for this visit.    REVIEW OF SYSTEMS:    Constitutional: ( - ) fevers, ( - )  chills , ( - ) night sweats Eyes: ( - ) blurriness of vision, ( - ) double vision, ( - ) watery eyes Ears, nose, mouth, throat, and face: ( - ) mucositis, ( - ) sore throat Respiratory: ( - ) cough, ( - ) dyspnea, ( - ) wheezes Cardiovascular: ( - ) palpitation, ( - ) chest discomfort, ( - ) lower extremity swelling Gastrointestinal:  ( - ) nausea, ( - ) heartburn, ( - ) change in bowel habits Skin: ( - ) abnormal skin rashes Lymphatics: ( - ) new lymphadenopathy, ( - ) easy bruising Neurological: ( - ) numbness, ( - ) tingling, ( - ) new weaknesses Behavioral/Psych: ( - )  mood change, ( - ) new changes  All other systems were reviewed with the patient and are negative.  PHYSICAL EXAMINATION:  Vitals:   01/15/24 1519  BP: 134/64  Pulse: 64  Resp: 13  Temp: 97.9 F (36.6 C)  SpO2: 99%   Filed Weights   01/15/24 1519  Weight: 166 lb 6.4 oz (75.5 kg)    GENERAL: alert, no distress and comfortable SKIN: skin color, texture, turgor are normal, no rashes or significant lesions EYES: conjunctiva are pink and non-injected, sclera clear LUNGS: clear to auscultation and percussion with normal breathing effort HEART: regular rate & rhythm and no murmurs and no lower extremity edema Musculoskeletal: no cyanosis of digits and no clubbing  PSYCH: alert & oriented x 3, fluent speech NEURO: no focal motor/sensory deficits  LABORATORY DATA:  I have reviewed the data as listed    Latest Ref Rng & Units 01/15/2024    2:48 PM 12/03/2023    1:50 PM 08/28/2023    2:07 PM  CBC  WBC 4.0 - 10.5 K/uL 4.2  4.5  5.0   Hemoglobin 12.0 - 15.0 g/dL 16.1  09.6  04.5   Hematocrit 36.0 - 46.0 % 35.5  37.7  37.1   Platelets 150 - 400 K/uL 270  306  320        Latest Ref Rng & Units 01/15/2024    2:48 PM 12/03/2023    1:50 PM 08/28/2023    2:07 PM  CMP  Glucose 70 - 99 mg/dL 98  409  96   BUN 8 - 23 mg/dL 18  21  16    Creatinine 0.44 - 1.00 mg/dL 8.11  9.14   7.82   Sodium 135 - 145 mmol/L 140  136  139   Potassium 3.5 - 5.1 mmol/L 4.2  3.8  3.7   Chloride 98 - 111 mmol/L 110  106  109   CO2 22 - 32 mmol/L 24  23  25    Calcium 8.9 - 10.3 mg/dL 9.1  9.3  9.4   Total Protein 6.5 - 8.1 g/dL 7.0  7.4  7.7   Total Bilirubin 0.0 - 1.2 mg/dL 0.4  0.6  0.5   Alkaline Phos 38 - 126 U/L 45  45  47   AST 15 - 41 U/L 16  18  25    ALT 0 - 44 U/L 17  23  29      ASSESSMENT & PLAN Robin Choi 63 y.o. 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  # Essential thrombocythemia (HCC) --Myeloproliferative disease, likely essential thrombocythemia, triple negative --Bone marrow biopsy results were consistent with ET --Labs today show white blood cell 4.2, hemoglobin 12.0, MCV 104.4, platelets 270 (on target) --Goal to keep platelets between 150 and 400.  --Continue  hydroxyurea  1000mg  x 1 day per week (Wed) and 500mg  daily for rest of the week.  --RTC in 3 months    # Hypothyroidism --currently managed by PCP, patient is requesting referral to endocrinology.   # Pulmonary embolism (HCC) --Continue Elqiuis 2.5mg  BID   # Nausea without vomiting --Intermittent symptoms.  Recommend Zofran  as needed as directed.    # Elevated serum creatinine-resolved --Encourage patient to increase oral hydration.  Orders Placed This Encounter  Procedures   Ambulatory referral to Endocrinology    Referral Priority:   Routine    Referral Type:   Consultation    Referral Reason:   Specialty Services Required  Referred to Provider:   Emilie Harden, MD    Number of Visits Requested:   1    All questions were answered. The patient knows to call the clinic with any problems, questions or concerns.  A total of more than 40 minutes were spent on this encounter with face-to-face time and non-face-to-face time, including preparing to see the patient, ordering tests and/or medications, counseling the patient  and coordination of care as outlined above.   Rogerio Clay, MD Department of Hematology/Oncology [facility] at [facility] Phone: [phone] Pager: [phone] Email: [email or web address]  01/20/2024 12:21 PM

## 2024-02-19 IMAGING — CT CT HEAD W/O CM
4 series · 16 of 47 positions shown, 18 images · non-contrast
Comparison: None Available.

CLINICAL DATA: headache on blood thinner



[Series 2: head bone · axial · 0.42mm/px · z∈[-101,-71]mm · 3 of 75 slices shown]
[im 8/75  bone]
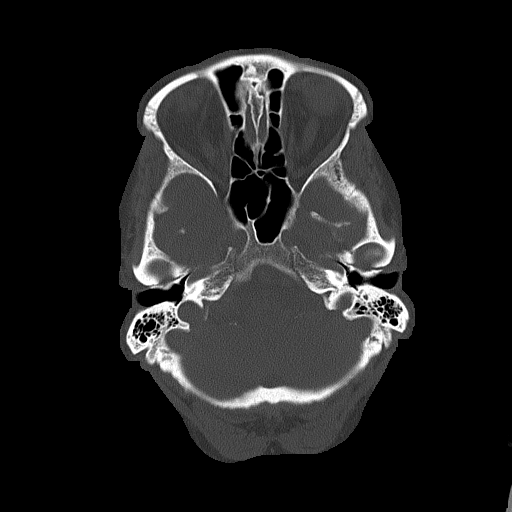
[im 15/75  bone]
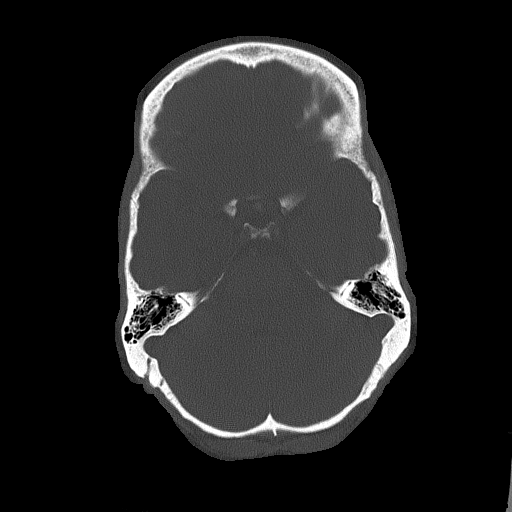
[im 23/75  bone]
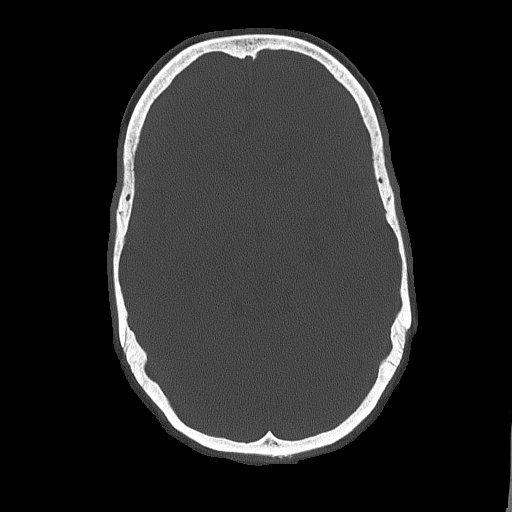

[Series 3: head wo · axial · 0.42mm/px · z∈[-100,+10]mm · 7 of 30 slices shown, 9 images]
[im 4/30  brain]
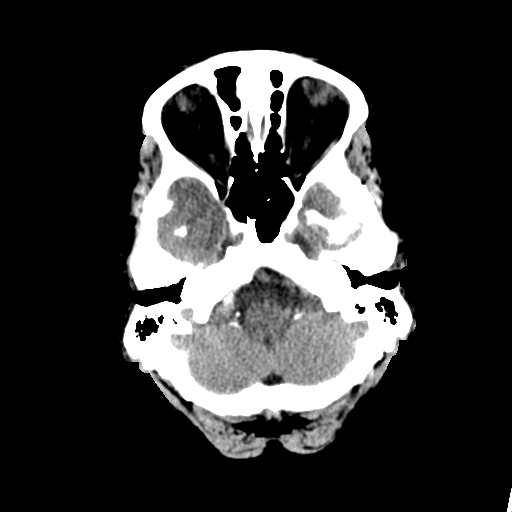
[im 4/30  bone]
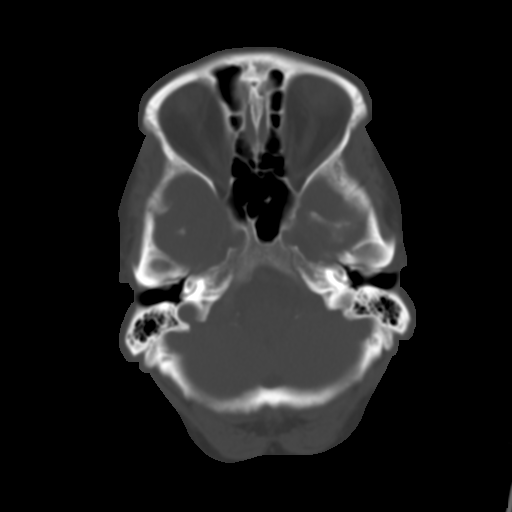
[im 8/30  brain]
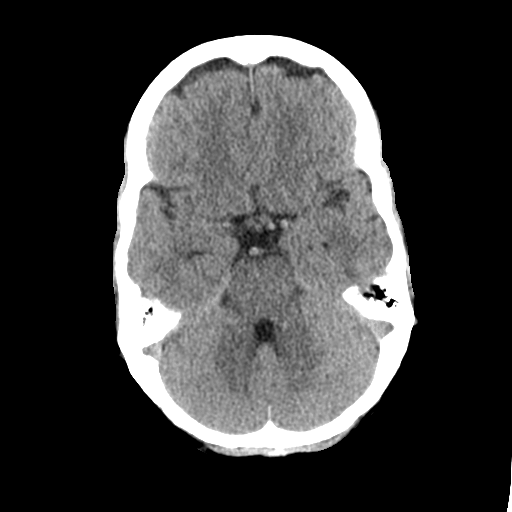
[im 11/30  brain]
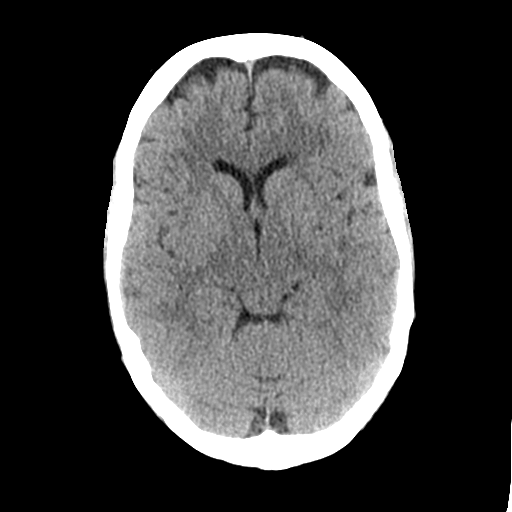
[im 15/30  brain]
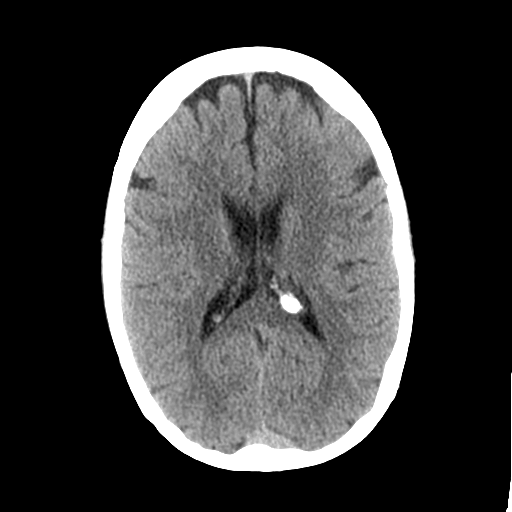
[im 19/30  brain]
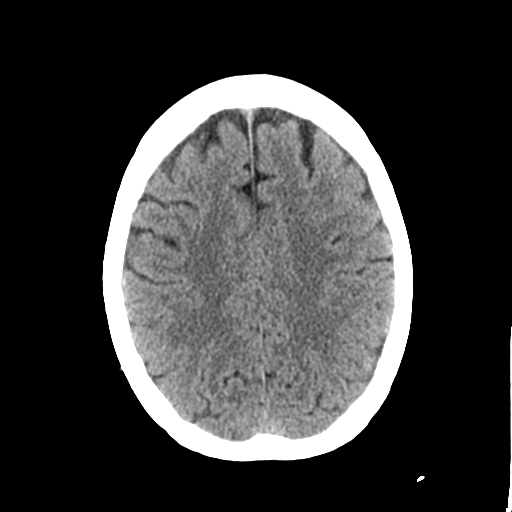
[im 19/30  bone]
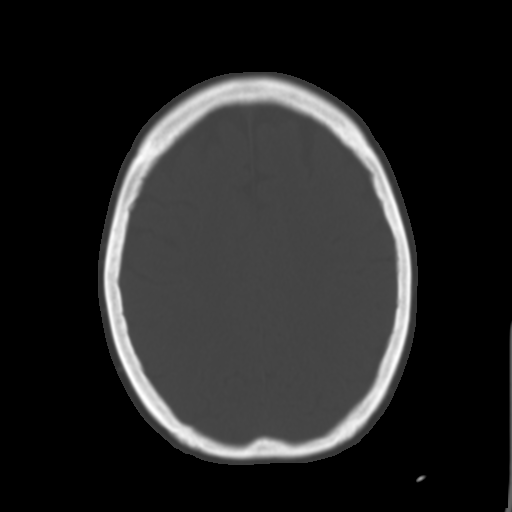
[im 22/30  brain]
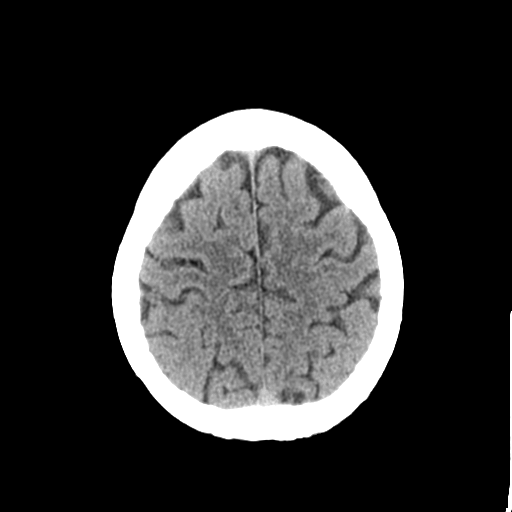
[im 26/30  brain]
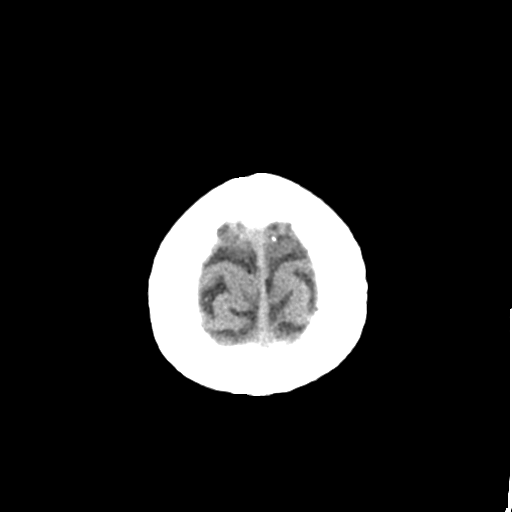

[Series 4: coronal soft tissue · coronal · 0.32mm/px · 3 of 66 slices shown]
[im 22/66  brain]
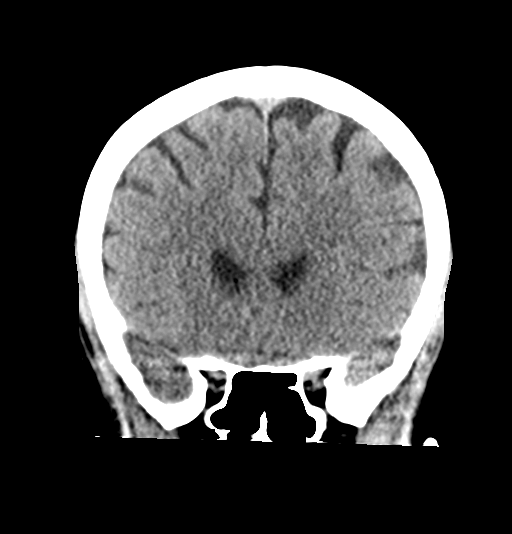
[im 29/66  brain]
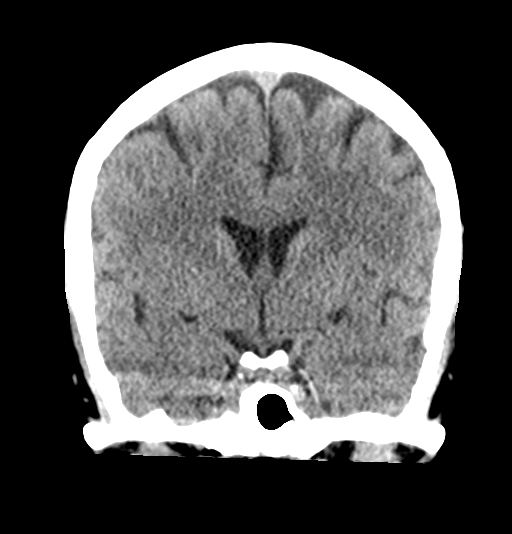
[im 37/66  brain]
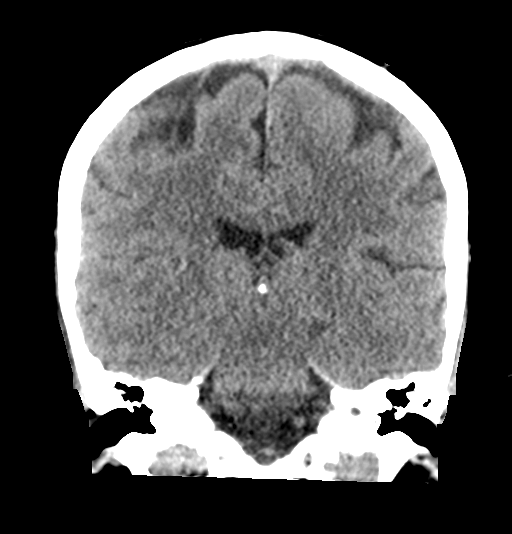

[Series 5: sagittal soft tissue · sagittal · 0.33mm/px · 3 of 48 slices shown]
[im 16/48  brain]
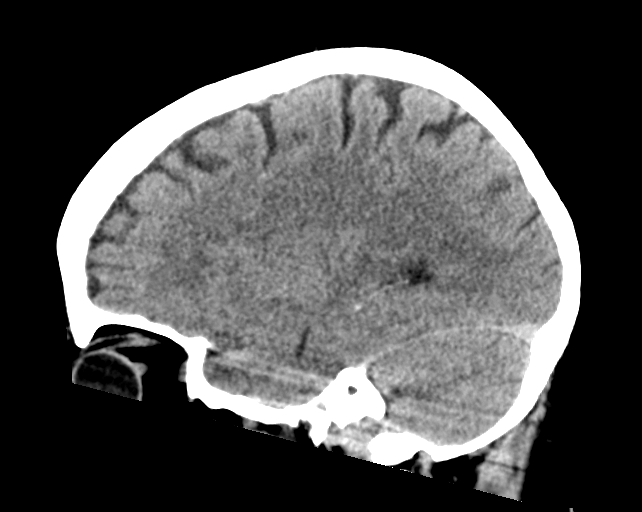
[im 24/48  brain]
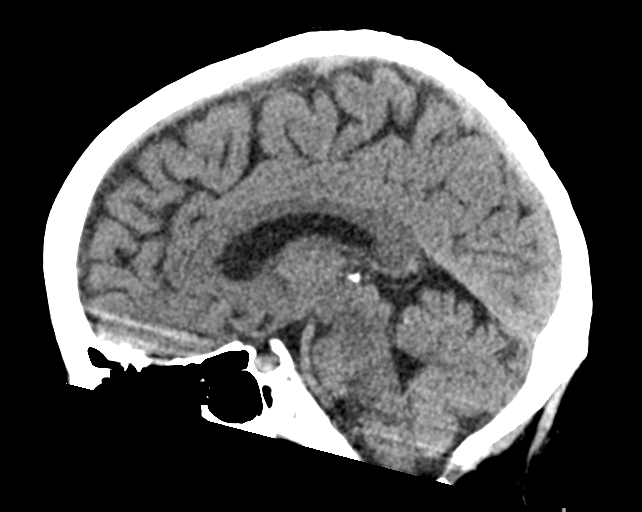
[im 32/48  brain]
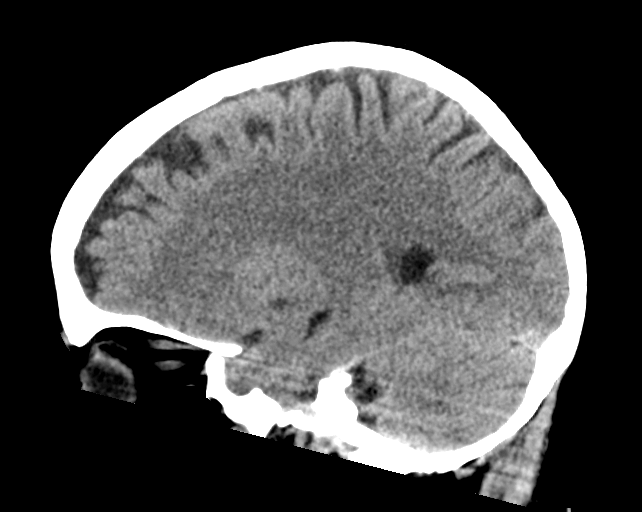

[16 of 47 positions shown; findings below may reference images not displayed]

FINDINGS: Brain: No evidence of acute infarction, hemorrhage, hydrocephalus,
extra-axial collection or mass lesion/mass effect. Discrete
hypodensity in the inferior left basal ganglia, most likely a benign
dilated perivascular space given characteristic location.

Vascular: No hyperdense vessel identified.

Skull: No acute fracture.

Sinuses/Orbits: Clear visualized sinuses. No acute orbital findings.

Other: No mastoid effusions.
IMPRESSION: No evidence of acute intracranial abnormality.

## 2024-02-26 IMAGING — CT CT ABD-PELV W/ CM
2 of 5 series · 16 of 46 positions shown, 18 images · IV contrast (APPLIED)
Comparison: None Available.

CLINICAL DATA: Generalized abdominal and pelvic pain.

EXAM:
CT ABDOMEN AND PELVIS WITH CONTRAST
TECHNIQUE: Multidetector CT imaging of the abdomen and pelvis was performed
using the standard protocol following bolus administration of
intravenous contrast.

[Series 2: routine abd/pel with · axial · 0.80mm/px · z∈[-460,-30]mm · 13 of 98 slices shown, 15 images]
[im 6/98  soft-tissue]
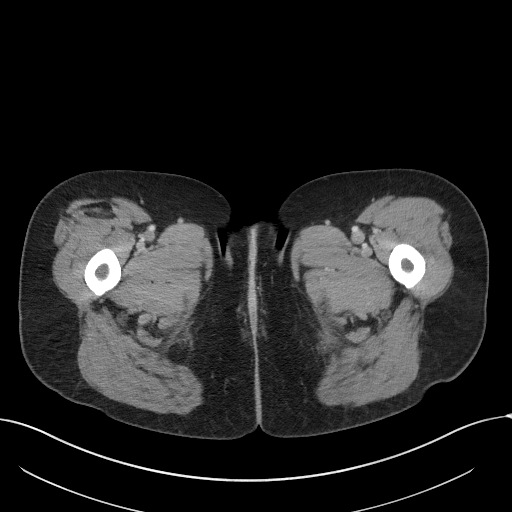
[im 6/98  bone]
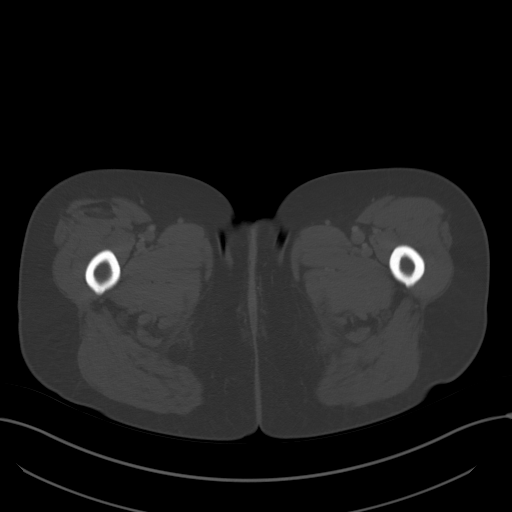
[im 16/98  soft-tissue]
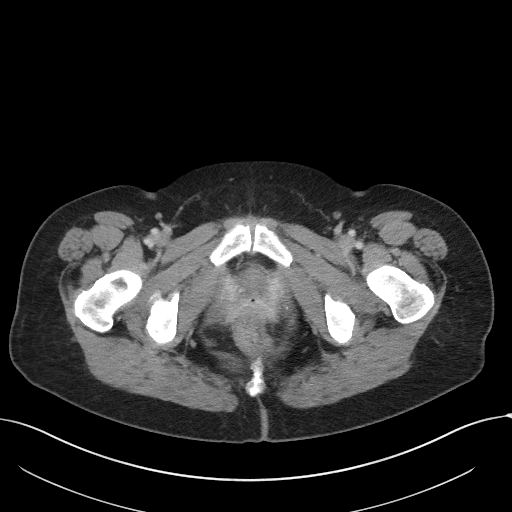
[im 21/98  soft-tissue]
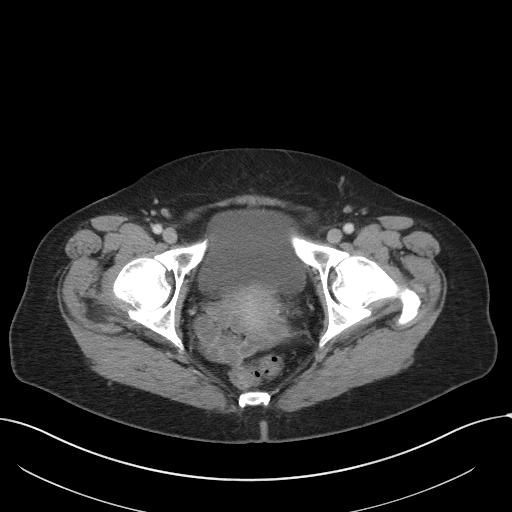
[im 26/98  soft-tissue]
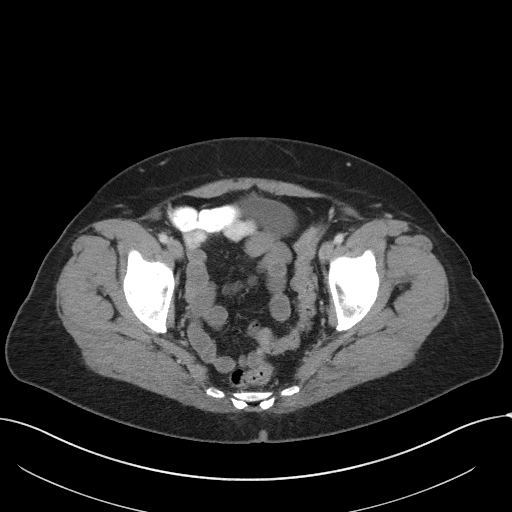
[im 36/98  soft-tissue]
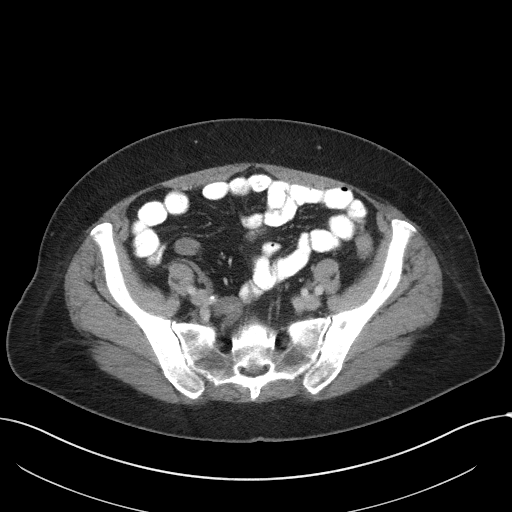
[im 41/98  soft-tissue]
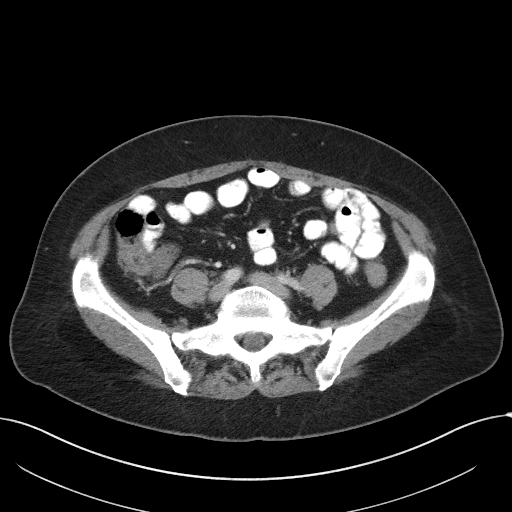
[im 52/98  soft-tissue]
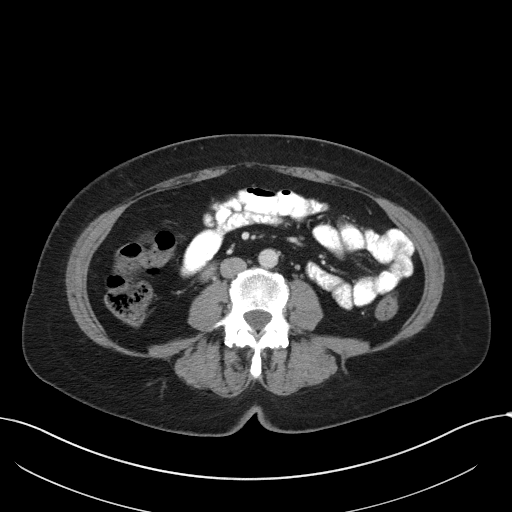
[im 57/98  soft-tissue]
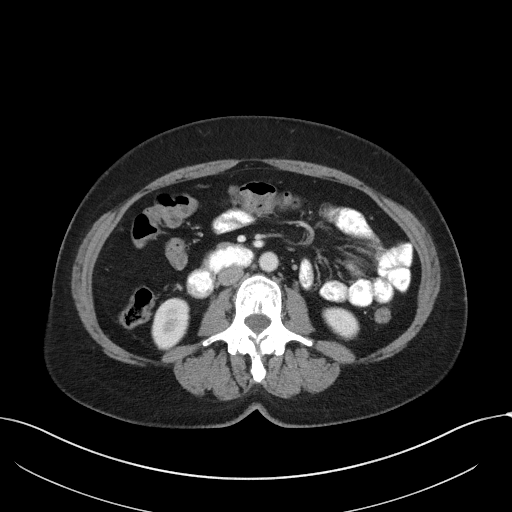
[im 62/98  soft-tissue]
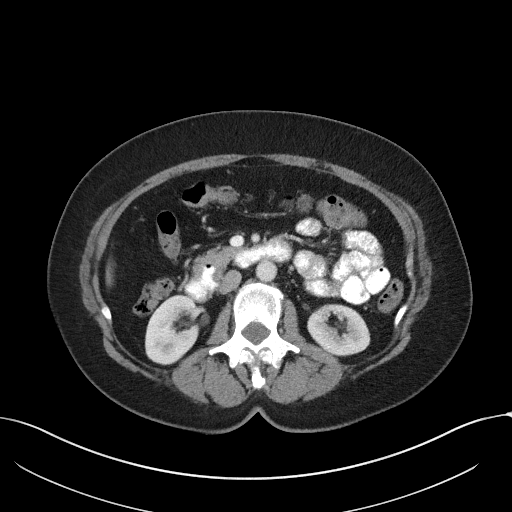
[im 62/98  bone]
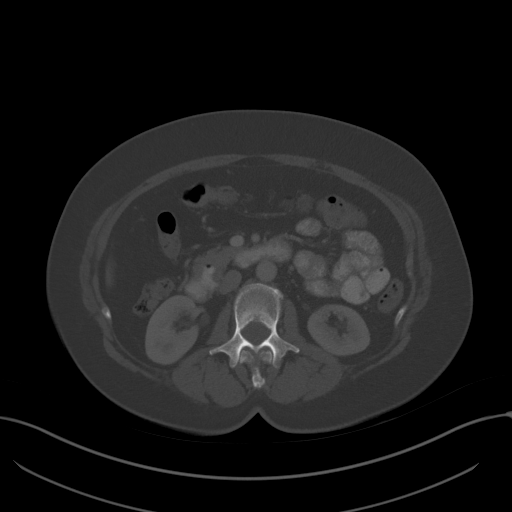
[im 72/98  soft-tissue]
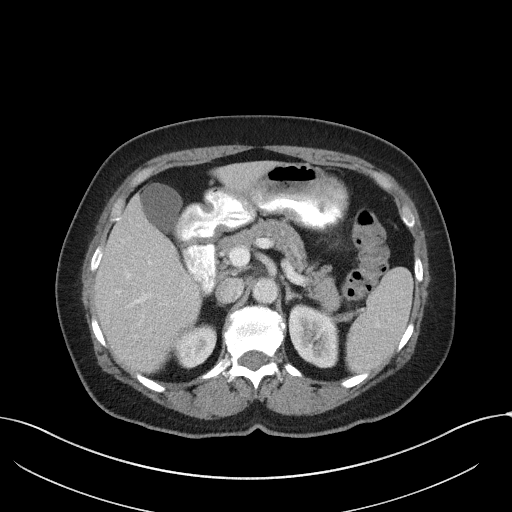
[im 77/98  soft-tissue]
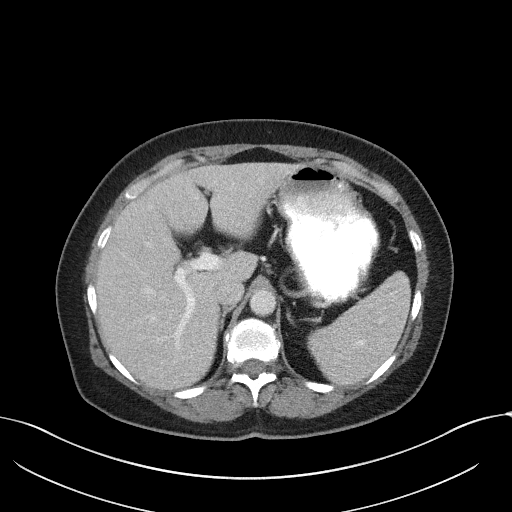
[im 82/98  soft-tissue]
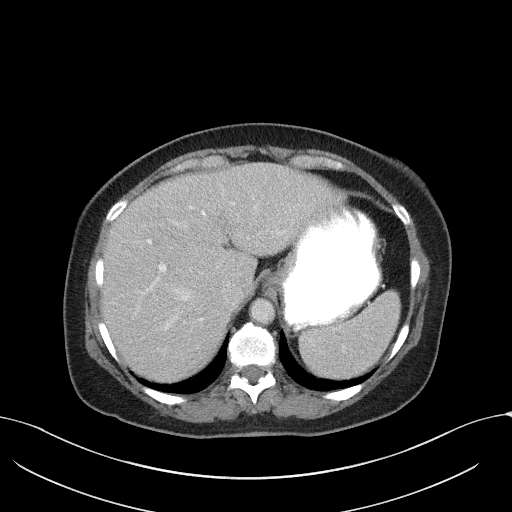
[im 92/98  soft-tissue]
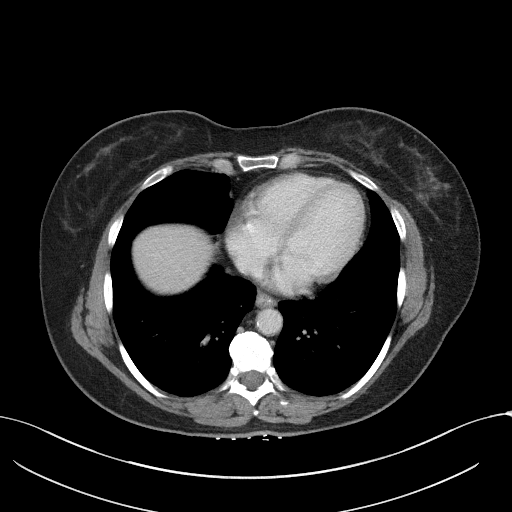

[Series 5: coronal st · coronal · 0.79mm/px · 3 of 92 slices shown]
[im 31/92  soft-tissue]
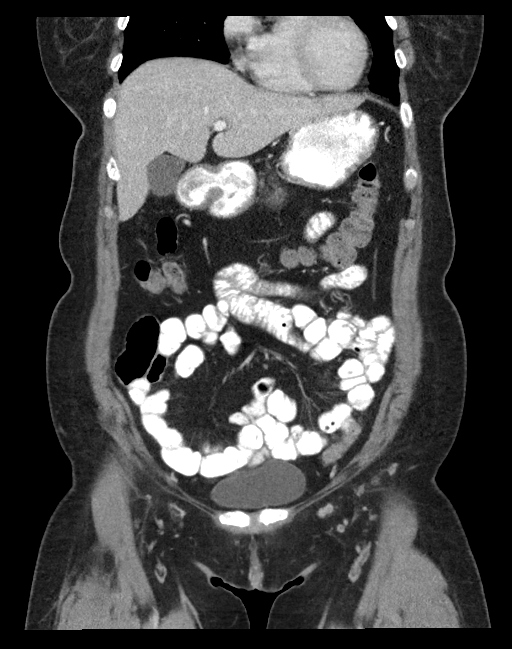
[im 41/92  soft-tissue]
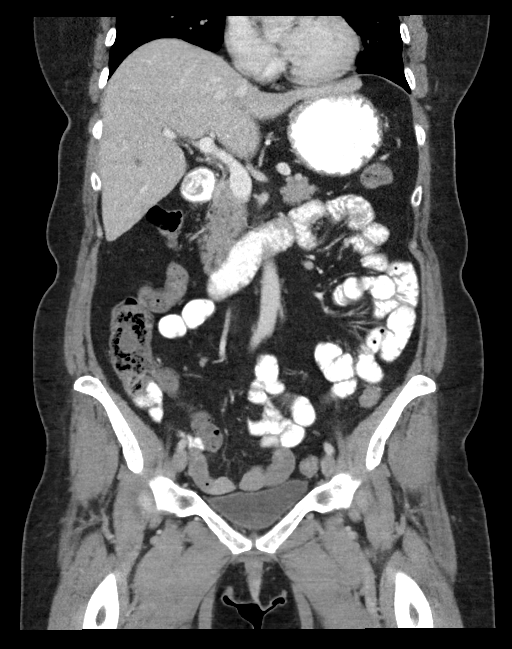
[im 51/92  soft-tissue]
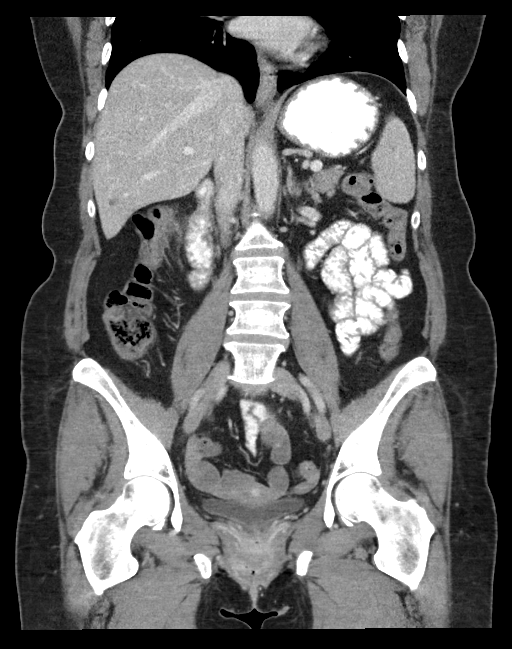

[16 of 46 positions shown; findings below may reference images not displayed]

RADIATION DOSE REDUCTION: This exam was performed according to the
departmental dose-optimization program which includes automated
exposure control, adjustment of the mA and/or kV according to
patient size and/or use of iterative reconstruction technique.

CONTRAST:  75mL OMNIPAQUE IOHEXOL 300 MG/ML  SOLN
FINDINGS: Lower Chest: No acute findings.

Hepatobiliary: No hepatic masses identified. A few tiny right
hepatic lobe cysts are noted. Gallbladder is unremarkable. No
evidence of biliary ductal dilatation.

Pancreas:  No mass or inflammatory changes.

Spleen: Within normal limits in size and appearance.

Adrenals/Urinary Tract: No masses identified. No evidence of
ureteral calculi or hydronephrosis.

Stomach/Bowel: No evidence of obstruction, inflammatory process or
abnormal fluid collections. Normal appendix visualized.

Vascular/Lymphatic: No pathologically enlarged lymph nodes. No acute
vascular findings.

Reproductive:  No mass or other significant abnormality.

Other:  None.

Musculoskeletal:  No suspicious bone lesions identified.
IMPRESSION: Negative. No acute findings or other significant abnormality.

## 2024-03-04 ENCOUNTER — Ambulatory Visit: Admitting: Oncology

## 2024-03-04 ENCOUNTER — Other Ambulatory Visit

## 2024-04-13 NOTE — Progress Notes (Signed)
 [facility] [phone]   [phone]  PROGRESS NOTE  Patient Care Team: Valora Agent, MD as PCP - General (Family Medicine) Hinton, Newell SQUIBB, MD (Family Medicine) Babara Call, MD as Consulting Physician (Oncology)  Hematological/Oncological History # Essential Thrombocytosis, Triple Negative  05/16/2022: Was seen by Dr. Federico for second opinion.  12/03/2023: last visit with Dr. Babara  01/15/2024: 1000 mg hydroxyurea  on Wednesday, 500 mg PO daily every other day of the week.  01/15/2024: transfer care to Dr. Federico   Interval History:  Robin Choi 63 y.o. 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 The patient's last visit was on 01/15/2024. In the interim since the last visit she has continued on hydroxyurea .  On exam today Ms. Sifuentes is accompanied by her husband.  She reports she continues taking her hydroxyurea  as prescribed 500 mg PO daily.  She reports her fatigue is improved but is still low.  Her energy is about a 5 out of 10.  She reports it is considerably less than it should be.  She reports that she does have some bouts of nausea but no vomiting or diarrhea.  She reports she also has some occasional numbness on the inside of her leg.  She is taking the hydroxyurea  500 mg p.o. daily at this time.  She is not developed any sores in her mouth or on her ankles.  She otherwise denies any fevers, chills, sweats.  A full 10 point ROS is otherwise negative.  MEDICAL HISTORY:  Past Medical History:  Diagnosis Date   GERD (gastroesophageal reflux disease)    Hypothyroidism     SURGICAL HISTORY: Past Surgical History:  Procedure Laterality Date   CARDIAC CATHETERIZATION     COLONOSCOPY WITH PROPOFOL  N/A 01/15/2022   Procedure: COLONOSCOPY WITH PROPOFOL ;  Surgeon: Therisa Bi, MD;  Location: [facility] ENDOSCOPY;  Service: Gastroenterology;  Laterality: N/A;  Needs an early appointment.  Has disabled  brother and easier to get sitter early in the morning.   ESOPHAGOGASTRODUODENOSCOPY (EGD) WITH PROPOFOL  N/A 09/04/2022   Procedure: ESOPHAGOGASTRODUODENOSCOPY (EGD) WITH PROPOFOL ;  Surgeon: Therisa Bi, MD;  Location: [facility] ENDOSCOPY;  Service: Gastroenterology;  Laterality: N/A;   NOVASURE ABLATION     OTHER SURGICAL HISTORY     tubal cauterization    SOCIAL HISTORY: Social History   Socioeconomic History   Marital status: Married    Spouse name: Not on file   Number of children: Not on file   Years of education: Not on file   Highest education level: Not on file  Occupational History   Not on file  Tobacco Use   Smoking status: Former    Current packs/day: 0.00    Types: Cigarettes    Quit date: 1980    Years since quitting: 45.6   Smokeless tobacco: Never   Tobacco comments:    Quit 40 years ago  Vaping Use   Vaping status: Never Used  Substance and Sexual Activity   Alcohol use: Not Currently    Comment: occational beer   Drug use: Never   Sexual activity: Not Currently  Other Topics Concern   Not on file  Social History Narrative   Not on file   Social Drivers of Health   Financial Resource Strain: Patient Declined (10/04/2023)   Received from [facility]   Overall Financial Resource Strain (CARDIA)    Difficulty of Paying Living Expenses: Patient declined  Food Insecurity: Patient  Declined (10/04/2023)   Received from [facility]   Hunger Vital Sign    Within the past 12 months, you worried that your food would run out before you got the money to buy more.: Patient declined    Within the past 12 months, the food you bought just didn't last and you didn't have money to get more.: Patient declined  Transportation Needs: Patient Declined (10/04/2023)   Received from [facility] - Transportation    In the past 12 months, has lack of transportation kept you from medical appointments or from getting  medications?: Patient declined    Lack of Transportation (Non-Medical): Patient declined  Physical Activity: Not on file  Stress: Not on file  Social Connections: Not on file  Intimate Partner Violence: Not on file    FAMILY HISTORY: Family History  Problem Relation Age of Onset   Diabetes Mother    Colon cancer Mother    Breast cancer Mother    Cancer Father        carcinoma   Bone cancer Brother    Diabetes Maternal Grandmother     ALLERGIES:  is allergic to celecoxib, nitrofurantoin macrocrystal, nitrofurantoin, and sulfa antibiotics.  MEDICATIONS:  Current Outpatient Medications  Medication Sig Dispense Refill   almotriptan (AXERT) 12.5 MG tablet  (Patient not taking: Reported on 12/03/2023)     apixaban  (ELIQUIS ) 2.5 MG TABS tablet Take 1 tablet (2.5 mg total) by mouth 2 (two) times daily. 180 tablet 1   ARMOUR THYROID  90 MG tablet Take 90 mg by mouth daily.     cetirizine (ZYRTEC) 10 MG tablet Take by mouth.     clobetasol  cream (TEMOVATE ) 0.05 % Apply to affected areas hands 1-2 times a day until rash improved. Avoid face, groin, underarms. 30 g 1   ergocalciferol (VITAMIN D2) 1.25 MG (50000 UT) capsule Take 50,000 Units by mouth once a week.     hydroxyurea  (HYDREA ) 500 MG capsule Take 1 capsule (500 mg total) by mouth See admin instructions. May take with food to minimize GI side effects. Take hydroxyurea  1000mg  for 1 day per week and 500mg  daily for rest of the week. 102 capsule 2   LANSOPRAZOLE PO Take by mouth.     ondansetron  (ZOFRAN ) 4 MG tablet Take 1 tablet (4 mg total) by mouth every 8 (eight) hours as needed for nausea or vomiting. 60 tablet 2   topiramate (TOPAMAX) 50 MG tablet Take 50 mg by mouth 2 (two) times daily.     valACYclovir (VALTREX) 1000 MG tablet      No current facility-administered medications for this visit.    REVIEW OF SYSTEMS:   Constitutional: ( - ) fevers, ( - )  chills , ( - ) night sweats Eyes: ( - ) blurriness of vision, ( - ) double  vision, ( - ) watery eyes Ears, nose, mouth, throat, and face: ( - ) mucositis, ( - ) sore throat Respiratory: ( - ) cough, ( - ) dyspnea, ( - ) wheezes Cardiovascular: ( - ) palpitation, ( - ) chest discomfort, ( - ) lower extremity swelling Gastrointestinal:  ( - ) nausea, ( - ) heartburn, ( - ) change in bowel habits Skin: ( - ) abnormal skin rashes Lymphatics: ( - ) new lymphadenopathy, ( - ) easy bruising Neurological: ( - ) numbness, ( - ) tingling, ( - ) new weaknesses Behavioral/Psych: ( - ) mood change, ( - ) new changes  All other systems were reviewed with the patient and are negative.  PHYSICAL EXAMINATION:  Vitals:   04/14/24 0953  BP: 121/80  Pulse: 65  Resp: 13  Temp: 97.7 F (36.5 C)  SpO2: 98%    Filed Weights   04/14/24 0953  Weight: 162 lb 3.2 oz (73.6 kg)     GENERAL: alert, no distress and comfortable SKIN: skin color, texture, turgor are normal, no rashes or significant lesions EYES: conjunctiva are pink and non-injected, sclera clear LUNGS: clear to auscultation and percussion with normal breathing effort HEART: regular rate & rhythm and no murmurs and no lower extremity edema Musculoskeletal: no cyanosis of digits and no clubbing  PSYCH: alert & oriented x 3, fluent speech NEURO: no focal motor/sensory deficits  LABORATORY DATA:  I have reviewed the data as listed    Latest Ref Rng & Units 04/14/2024    9:27 AM 01/15/2024    2:48 PM 12/03/2023    1:50 PM  CBC  WBC 4.0 - 10.5 K/uL 4.2  4.2  4.5   Hemoglobin 12.0 - 15.0 g/dL 86.9  87.9  87.3   Hematocrit 36.0 - 46.0 % 38.7  35.5  37.7   Platelets 150 - 400 K/uL 276  270  306        Latest Ref Rng & Units 04/14/2024    9:27 AM 01/15/2024    2:48 PM 12/03/2023    1:50 PM  CMP  Glucose 70 - 99 mg/dL 897  98  894   BUN 8 - 23 mg/dL 13  18  21    Creatinine 0.44 - 1.00 mg/dL 9.03  9.01  8.96   Sodium 135 - 145 mmol/L 139  140  136   Potassium 3.5 - 5.1 mmol/L 4.6  4.2  3.8   Chloride 98 - 111  mmol/L 110  110  106   CO2 22 - 32 mmol/L 25  24  23    Calcium 8.9 - 10.3 mg/dL 9.3  9.1  9.3   Total Protein 6.5 - 8.1 g/dL 7.3  7.0  7.4   Total Bilirubin 0.0 - 1.2 mg/dL 0.4  0.4  0.6   Alkaline Phos 38 - 126 U/L 46  45  45   AST 15 - 41 U/L 20  16  18    ALT 0 - 44 U/L 23  17  23      ASSESSMENT & PLAN Sahira Cataldi 63 y.o. 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  # Essential thrombocythemia (HCC) --Myeloproliferative disease, likely essential thrombocythemia, triple negative --Bone marrow biopsy results were consistent with ET --Labs today show white blood cell 4.2, Hgb 13.0, MCV 104.6, Plt 276 (on target) --Goal to keep platelets between 150 and 400.  --Continue  hydroxyurea  500mg  PO daily   --RTC in 3 months with labs and clinic visit   # Hypothyroidism --currently managed by PCP, patient is requesting referral to endocrinology.   # Pulmonary embolism (HCC) --Continue Elqiuis 2.5mg  BID   # Nausea without vomiting --Intermittent symptoms.  Recommend Zofran  as needed as directed.    # Elevated serum creatinine-resolved --Encourage patient to increase oral hydration.  No orders of the defined types were placed in this encounter.   All questions were answered. The patient knows to call the clinic with any problems, questions or concerns.  A total of more than 30 minutes were spent on this encounter with face-to-face time and non-face-to-face time, including preparing  to see the patient, ordering tests and/or medications, counseling the patient and coordination of care as outlined above.   Norleen IVAR Kidney, MD Department of Hematology/Oncology [facility] at [facility] Phone: [phone] Pager: [phone] Email: [email or web address]  04/20/2024 4:45 PM

## 2024-04-14 ENCOUNTER — Other Ambulatory Visit

## 2024-04-14 ENCOUNTER — Ambulatory Visit: Admitting: Hematology and Oncology

## 2024-04-14 ENCOUNTER — Other Ambulatory Visit: Payer: Self-pay | Admitting: *Deleted

## 2024-04-14 ENCOUNTER — Inpatient Hospital Stay: Admitting: Hematology and Oncology

## 2024-04-14 ENCOUNTER — Inpatient Hospital Stay: Attending: Oncology

## 2024-04-14 VITALS — BP 121/80 | HR 65 | Temp 97.7°F | Resp 13 | Wt 162.2 lb

## 2024-04-14 DIAGNOSIS — E039 Hypothyroidism, unspecified: Secondary | ICD-10-CM | POA: Insufficient documentation

## 2024-04-14 DIAGNOSIS — Z79899 Other long term (current) drug therapy: Secondary | ICD-10-CM | POA: Diagnosis not present

## 2024-04-14 DIAGNOSIS — Z803 Family history of malignant neoplasm of breast: Secondary | ICD-10-CM | POA: Insufficient documentation

## 2024-04-14 DIAGNOSIS — D473 Essential (hemorrhagic) thrombocythemia: Secondary | ICD-10-CM

## 2024-04-14 DIAGNOSIS — Z87891 Personal history of nicotine dependence: Secondary | ICD-10-CM | POA: Insufficient documentation

## 2024-04-14 DIAGNOSIS — Z800 Family history of malignant neoplasm of digestive organs: Secondary | ICD-10-CM | POA: Insufficient documentation

## 2024-04-14 DIAGNOSIS — I2699 Other pulmonary embolism without acute cor pulmonale: Secondary | ICD-10-CM | POA: Insufficient documentation

## 2024-04-14 DIAGNOSIS — D471 Chronic myeloproliferative disease: Secondary | ICD-10-CM | POA: Insufficient documentation

## 2024-04-14 DIAGNOSIS — R110 Nausea: Secondary | ICD-10-CM | POA: Insufficient documentation

## 2024-04-14 DIAGNOSIS — Z808 Family history of malignant neoplasm of other organs or systems: Secondary | ICD-10-CM | POA: Diagnosis not present

## 2024-04-14 LAB — CBC WITH DIFFERENTIAL (CANCER CENTER ONLY)
Abs Immature Granulocytes: 0.02 K/uL (ref 0.00–0.07)
Basophils Absolute: 0.1 K/uL (ref 0.0–0.1)
Basophils Relative: 1 %
Eosinophils Absolute: 0.1 K/uL (ref 0.0–0.5)
Eosinophils Relative: 3 %
HCT: 38.7 % (ref 36.0–46.0)
Hemoglobin: 13 g/dL (ref 12.0–15.0)
Immature Granulocytes: 1 %
Lymphocytes Relative: 36 %
Lymphs Abs: 1.5 K/uL (ref 0.7–4.0)
MCH: 35.1 pg — ABNORMAL HIGH (ref 26.0–34.0)
MCHC: 33.6 g/dL (ref 30.0–36.0)
MCV: 104.6 fL — ABNORMAL HIGH (ref 80.0–100.0)
Monocytes Absolute: 0.4 K/uL (ref 0.1–1.0)
Monocytes Relative: 10 %
Neutro Abs: 2 K/uL (ref 1.7–7.7)
Neutrophils Relative %: 49 %
Platelet Count: 276 K/uL (ref 150–400)
RBC: 3.7 MIL/uL — ABNORMAL LOW (ref 3.87–5.11)
RDW: 12.1 % (ref 11.5–15.5)
WBC Count: 4.2 K/uL (ref 4.0–10.5)
nRBC: 0 % (ref 0.0–0.2)

## 2024-04-14 LAB — CMP (CANCER CENTER ONLY)
ALT: 23 U/L (ref 0–44)
AST: 20 U/L (ref 15–41)
Albumin: 4.2 g/dL (ref 3.5–5.0)
Alkaline Phosphatase: 46 U/L (ref 38–126)
Anion gap: 4 — ABNORMAL LOW (ref 5–15)
BUN: 13 mg/dL (ref 8–23)
CO2: 25 mmol/L (ref 22–32)
Calcium: 9.3 mg/dL (ref 8.9–10.3)
Chloride: 110 mmol/L (ref 98–111)
Creatinine: 0.96 mg/dL (ref 0.44–1.00)
GFR, Estimated: >60 mL/min (ref >60)
Glucose, Bld: 102 mg/dL — ABNORMAL HIGH (ref 70–99)
Potassium: 4.6 mmol/L (ref 3.5–5.1)
Sodium: 139 mmol/L (ref 135–145)
Total Bilirubin: 0.4 mg/dL (ref 0.0–1.2)
Total Protein: 7.3 g/dL (ref 6.5–8.1)

## 2024-05-03 ENCOUNTER — Ambulatory Visit (INDEPENDENT_AMBULATORY_CARE_PROVIDER_SITE_OTHER): Admitting: "Endocrinology

## 2024-05-03 ENCOUNTER — Encounter: Payer: Self-pay | Admitting: "Endocrinology

## 2024-05-03 VITALS — BP 110/80 | HR 74 | Ht 63.0 in | Wt 164.0 lb

## 2024-05-03 DIAGNOSIS — E039 Hypothyroidism, unspecified: Secondary | ICD-10-CM | POA: Diagnosis not present

## 2024-05-03 DIAGNOSIS — R1310 Dysphagia, unspecified: Secondary | ICD-10-CM | POA: Diagnosis not present

## 2024-05-03 LAB — TSH: TSH: 0.82 m[IU]/L (ref 0.40–4.50)

## 2024-05-03 LAB — T3, FREE: T3, Free: 3.8 pg/mL (ref 2.3–4.2)

## 2024-05-03 LAB — T4, FREE: Free T4: 0.9 ng/dL (ref 0.8–1.8)

## 2024-05-03 NOTE — Progress Notes (Addendum)
 Outpatient Endocrinology Note Robin Birmingham, MD  05/03/24   Robin Choi 06/20/1961 [id number]  Referring Provider: Federico Norleen ONEIDA MADISON, MD Primary Care Provider: Valora Agent, MD Subjective  No chief complaint on file.   Assessment & Plan  Diagnoses and all orders for this visit:  Acquired hypothyroidism -     TSH -     T3, free -     T4, free -     US  THYROID ; Future -     US  THYROID   Dysphagia, unspecified type    Robin Choi is currently taking armour thyroid  90 mg qd. Patient was last currently biochemically euthyroid.  Educated on thyroid  axis.  Recommend the following: Take armour thyroid  90 mg every day.  Advised to take levothyroxine first thing in the morning on empty stomach and wait at least 30 minutes to 1 hour before eating or drinking anything or taking any other medications. Space out levothyroxine by 4 hours from any acid reflux medication/fibrate/iron/calcium/multivitamin.  Repeat lab before next visit or sooner if symptoms of hyperthyroidism or hypothyroidism develop.  Notify us  immediately in case of significant weight gain or loss. Counseled on compliance and follow up needs.  C/o dysphagia, history of goiter  S/p esophageal dilation in past R/o thyroid  nodule, ordered thyroid  U/S  I have reviewed current medications, nurse's notes, allergies, vital signs, past medical and surgical history, family medical history, and social history for this encounter. Counseled patient on symptoms, examination findings, lab findings, imaging results, treatment decisions and monitoring and prognosis. The patient understood the recommendations and agrees with the treatment plan. All questions regarding treatment plan were fully answered.   Return in about 4 months (around 09/02/2024) for visit, labs today.   Robin Birmingham, MD  05/03/24   I have reviewed current medications, nurse's notes, allergies, vital signs, past medical and surgical  history, family medical history, and social history for this encounter. Counseled patient on symptoms, examination findings, lab findings, imaging results, treatment decisions and monitoring and prognosis. The patient understood the recommendations and agrees with the treatment plan. All questions regarding treatment plan were fully answered.  History of Present Illness Robin Choi is a 63 y.o. year old female who presents to our clinic with hypothyroidism diagnosed in 2017.    Symptoms suggestive of HYPOTHYROIDISM:  fatigue Yes weight gain Yes cold intolerance  No constipation  No  Symptoms suggestive of HYPERTHYROIDISM:  weight loss  No heat intolerance No hyperdefecation  No palpitations  Yes, one in a while   Compressive symptoms:  dysphagia  Yes, s/p esophageal dilation  dysphonia  Yes, deeper/raspier  positional dyspnea (especially with simultaneous arms elevation)  No  Smokes  No On biotin  No Personal history of head/neck surgery/irradiation  No  05/2022 CT ANGIOGRAPHY CHEST WITH CONTRAST: Thyroid  gland, trachea, and esophagus demonstrate no significant findings.  Physical Exam  BP 110/80   Pulse 74   Ht 5' 3 (1.6 m)   Wt 164 lb (74.4 kg)   SpO2 96%   BMI 29.05 kg/m  Constitutional: well developed, well nourished Head: normocephalic, atraumatic, no exophthalmos Eyes: sclera anicteric, no redness Neck: + thyromegaly, no thyroid  tenderness; no nodules palpated Lungs: normal respiratory effort Neurology: alert and oriented, no fine hand tremor Skin: dry, no appreciable rashes Musculoskeletal: no appreciable defects Psychiatric: normal mood and affect  Allergies Allergies  Allergen Reactions   Celecoxib Hives   Nitrofurantoin Macrocrystal Hives   Nitrofurantoin Hives   Sulfa Antibiotics Hives  Current Medications Patient's Medications  New Prescriptions   No medications on file  Previous Medications   ALMOTRIPTAN (AXERT) 12.5 MG TABLET        APIXABAN  (ELIQUIS ) 2.5 MG TABS TABLET    Take 1 tablet (2.5 mg total) by mouth 2 (two) times daily.   ARMOUR THYROID  90 MG TABLET    Take 90 mg by mouth daily.   CETIRIZINE (ZYRTEC) 10 MG TABLET    Take by mouth.   CLOBETASOL  CREAM (TEMOVATE ) 0.05 %    Apply to affected areas hands 1-2 times a day until rash improved. Avoid face, groin, underarms.   ERGOCALCIFEROL (VITAMIN D2) 1.25 MG (50000 UT) CAPSULE    Take 50,000 Units by mouth once a week.   HYDROXYUREA  (HYDREA ) 500 MG CAPSULE    Take 1 capsule (500 mg total) by mouth See admin instructions. May take with food to minimize GI side effects. Take hydroxyurea  1000mg  for 1 day per week and 500mg  daily for rest of the week.   LANSOPRAZOLE PO    Take by mouth.   ONDANSETRON  (ZOFRAN ) 4 MG TABLET    Take 1 tablet (4 mg total) by mouth every 8 (eight) hours as needed for nausea or vomiting.   TOPIRAMATE (TOPAMAX) 50 MG TABLET    Take 50 mg by mouth 2 (two) times daily.   VALACYCLOVIR (VALTREX) 1000 MG TABLET      Modified Medications   No medications on file  Discontinued Medications   No medications on file    Past Medical History Past Medical History:  Diagnosis Date   GERD (gastroesophageal reflux disease)    Hypothyroidism     Past Surgical History Past Surgical History:  Procedure Laterality Date   CARDIAC CATHETERIZATION     COLONOSCOPY WITH PROPOFOL  N/A 01/15/2022   Procedure: COLONOSCOPY WITH PROPOFOL ;  Surgeon: Therisa Bi, MD;  Location: [facility] ENDOSCOPY;  Service: Gastroenterology;  Laterality: N/A;  Needs an early appointment.  Has disabled brother and easier to get sitter early in the morning.   ESOPHAGOGASTRODUODENOSCOPY (EGD) WITH PROPOFOL  N/A 09/04/2022   Procedure: ESOPHAGOGASTRODUODENOSCOPY (EGD) WITH PROPOFOL ;  Surgeon: Therisa Bi, MD;  Location: [facility] ENDOSCOPY;  Service: Gastroenterology;  Laterality: N/A;   NOVASURE ABLATION     OTHER SURGICAL HISTORY     tubal cauterization    Family History family history  includes Bone cancer in her brother; Breast cancer in her mother; Cancer in her father; Colon cancer in her mother; Diabetes in her maternal grandmother and mother.  Social History Social History   Socioeconomic History   Marital status: Married    Spouse name: Not on file   Number of children: Not on file   Years of education: Not on file   Highest education level: Not on file  Occupational History   Not on file  Tobacco Use   Smoking status: Former    Current packs/day: 0.00    Types: Cigarettes    Quit date: 1980    Years since quitting: 45.6   Smokeless tobacco: Never   Tobacco comments:    Quit 40 years ago  Vaping Use   Vaping status: Never Used  Substance and Sexual Activity   Alcohol use: Not Currently    Comment: occational beer   Drug use: Never   Sexual activity: Not Currently  Other Topics Concern   Not on file  Social History Narrative   Not on file   Social Drivers of Health   Financial Resource Strain: Patient Declined (10/04/2023)  Received from [facility]   Overall Financial Resource Strain (CARDIA)    Difficulty of Paying Living Expenses: Patient declined  Food Insecurity: Patient Declined (10/04/2023)   Received from [facility]   Hunger Vital Sign    Within the past 12 months, you worried that your food would run out before you got the money to buy more.: Patient declined    Within the past 12 months, the food you bought just didn't last and you didn't have money to get more.: Patient declined  Transportation Needs: Patient Declined (10/04/2023)   Received from [facility]   PRAPARE - Transportation    In the past 12 months, has lack of transportation kept you from medical appointments or from getting medications?: Patient declined    Lack of Transportation (Non-Medical): Patient declined  Physical Activity: Not on file  Stress: Not on file  Social Connections: Not on file  Intimate Partner  Violence: Not on file    Laboratory Investigations Lab Results  Component Value Date   TSH 2.371 04/09/2023   TSH 1.549 01/02/2022     No results found for: TSI   No components found for: TRAB   No results found for: CHOL No results found for: HDL No results found for: LDLCALC No results found for: TRIG No results found for: [facility] Lab Results  Component Value Date   CREATININE 0.96 04/14/2024   No results found for: GFR    Component Value Date/Time   NA 139 04/14/2024 0927   K 4.6 04/14/2024 0927   CL 110 04/14/2024 0927   CO2 25 04/14/2024 0927   GLUCOSE 102 (H) 04/14/2024 0927   BUN 13 04/14/2024 0927   CREATININE 0.96 04/14/2024 0927   CALCIUM 9.3 04/14/2024 0927   PROT 7.3 04/14/2024 0927   ALBUMIN 4.2 04/14/2024 0927   AST 20 04/14/2024 0927   ALT 23 04/14/2024 0927   ALKPHOS 46 04/14/2024 0927   BILITOT 0.4 04/14/2024 0927   GFRNONAA >60 04/14/2024 0927      Latest Ref Rng & Units 04/14/2024    9:27 AM 01/15/2024    2:48 PM 12/03/2023    1:50 PM  BMP  Glucose 70 - 99 mg/dL 897  98  894   BUN 8 - 23 mg/dL 13  18  21    Creatinine 0.44 - 1.00 mg/dL 9.03  9.01  8.96   Sodium 135 - 145 mmol/L 139  140  136   Potassium 3.5 - 5.1 mmol/L 4.6  4.2  3.8   Chloride 98 - 111 mmol/L 110  110  106   CO2 22 - 32 mmol/L 25  24  23    Calcium 8.9 - 10.3 mg/dL 9.3  9.1  9.3        Component Value Date/Time   WBC 4.2 04/14/2024 0927   WBC 3.9 (L) 01/08/2023 1401   RBC 3.70 (L) 04/14/2024 0927   HGB 13.0 04/14/2024 0927   HCT 38.7 04/14/2024 0927   PLT 276 04/14/2024 0927   MCV 104.6 (H) 04/14/2024 0927   MCH 35.1 (H) 04/14/2024 0927   MCHC 33.6 04/14/2024 0927   RDW 12.1 04/14/2024 0927   LYMPHSABS 1.5 04/14/2024 0927   MONOABS 0.4 04/14/2024 0927   EOSABS 0.1 04/14/2024 0927   BASOSABS 0.1 04/14/2024 0927      Parts of this note may have been dictated using voice recognition software. There may be variances in spelling and vocabulary which  are unintentional.  Not all errors are proofread. Please notify the dino if any discrepancies are noted or if the meaning of any statement is not clear.

## 2024-05-06 ENCOUNTER — Ambulatory Visit
Admission: RE | Admit: 2024-05-06 | Discharge: 2024-05-06 | Disposition: A | Discharge status: Home / Self Care | Source: Ambulatory Visit | Attending: "Endocrinology | Admitting: "Endocrinology

## 2024-05-06 DIAGNOSIS — E039 Hypothyroidism, unspecified: Secondary | ICD-10-CM | POA: Insufficient documentation

## 2024-06-12 ENCOUNTER — Other Ambulatory Visit: Payer: Self-pay | Admitting: Oncology

## 2024-07-09 ENCOUNTER — Inpatient Hospital Stay: Attending: Oncology

## 2024-07-09 ENCOUNTER — Other Ambulatory Visit: Payer: Self-pay | Admitting: Hematology and Oncology

## 2024-07-09 ENCOUNTER — Inpatient Hospital Stay (HOSPITAL_BASED_OUTPATIENT_CLINIC_OR_DEPARTMENT_OTHER): Admitting: Hematology and Oncology

## 2024-07-09 VITALS — BP 130/82 | HR 55 | Temp 97.8°F | Resp 13 | Wt 163.7 lb

## 2024-07-09 DIAGNOSIS — E039 Hypothyroidism, unspecified: Secondary | ICD-10-CM | POA: Insufficient documentation

## 2024-07-09 DIAGNOSIS — D473 Essential (hemorrhagic) thrombocythemia: Secondary | ICD-10-CM | POA: Insufficient documentation

## 2024-07-09 DIAGNOSIS — R110 Nausea: Secondary | ICD-10-CM | POA: Diagnosis not present

## 2024-07-09 DIAGNOSIS — Z7901 Long term (current) use of anticoagulants: Secondary | ICD-10-CM | POA: Insufficient documentation

## 2024-07-09 DIAGNOSIS — I2699 Other pulmonary embolism without acute cor pulmonale: Secondary | ICD-10-CM | POA: Insufficient documentation

## 2024-07-09 LAB — IRON AND IRON BINDING CAPACITY (CC-WL,HP ONLY)
Iron: 105 ug/dL (ref 28–170)
Saturation Ratios: 35 % — ABNORMAL HIGH (ref 10.4–31.8)
TIBC: 301 ug/dL (ref 250–450)
UIBC: 196 ug/dL (ref 148–442)

## 2024-07-09 LAB — CBC WITH DIFFERENTIAL (CANCER CENTER ONLY)
Abs Immature Granulocytes: 0.01 K/uL (ref 0.00–0.07)
Basophils Absolute: 0.1 K/uL (ref 0.0–0.1)
Basophils Relative: 1 %
Eosinophils Absolute: 0.2 K/uL (ref 0.0–0.5)
Eosinophils Relative: 4 %
HCT: 39.3 % (ref 36.0–46.0)
Hemoglobin: 13.2 g/dL (ref 12.0–15.0)
Immature Granulocytes: 0 %
Lymphocytes Relative: 36 %
Lymphs Abs: 1.7 K/uL (ref 0.7–4.0)
MCH: 34.7 pg — ABNORMAL HIGH (ref 26.0–34.0)
MCHC: 33.6 g/dL (ref 30.0–36.0)
MCV: 103.4 fL — ABNORMAL HIGH (ref 80.0–100.0)
Monocytes Absolute: 0.5 K/uL (ref 0.1–1.0)
Monocytes Relative: 10 %
Neutro Abs: 2.2 K/uL (ref 1.7–7.7)
Neutrophils Relative %: 49 %
Platelet Count: 313 K/uL (ref 150–400)
RBC: 3.8 MIL/uL — ABNORMAL LOW (ref 3.87–5.11)
RDW: 12.4 % (ref 11.5–15.5)
WBC Count: 4.6 K/uL (ref 4.0–10.5)
nRBC: 0 % (ref 0.0–0.2)

## 2024-07-09 LAB — CMP (CANCER CENTER ONLY)
ALT: 27 U/L (ref 0–44)
AST: 21 U/L (ref 15–41)
Albumin: 4.3 g/dL (ref 3.5–5.0)
Alkaline Phosphatase: 50 U/L (ref 38–126)
Anion gap: 5 (ref 5–15)
BUN: 13 mg/dL (ref 8–23)
CO2: 26 mmol/L (ref 22–32)
Calcium: 10 mg/dL (ref 8.9–10.3)
Chloride: 108 mmol/L (ref 98–111)
Creatinine: 0.88 mg/dL (ref 0.44–1.00)
GFR, Estimated: >60 mL/min (ref >60)
Glucose, Bld: 87 mg/dL (ref 70–99)
Potassium: 4.4 mmol/L (ref 3.5–5.1)
Sodium: 139 mmol/L (ref 135–145)
Total Bilirubin: 0.5 mg/dL (ref 0.0–1.2)
Total Protein: 7.7 g/dL (ref 6.5–8.1)

## 2024-07-09 LAB — RETIC PANEL
Immature Retic Fract: 10.9 % (ref 2.3–15.9)
RBC.: 3.82 MIL/uL — ABNORMAL LOW (ref 3.87–5.11)
Retic Count, Absolute: 65.3 K/uL (ref 19.0–186.0)
Retic Ct Pct: 1.7 % (ref 0.4–3.1)
Reticulocyte Hemoglobin: 38.7 pg (ref >27.9)

## 2024-07-09 LAB — FERRITIN: Ferritin: 215 ng/mL (ref 11–307)

## 2024-07-09 NOTE — Progress Notes (Signed)
 [facility] Telephone:([phone]) [phone]   [phone]  PROGRESS NOTE  Patient Care Team: Valora Lynwood FALCON, MD as PCP - General (Family Medicine) Robin Choi, Robin SQUIBB, MD (Family Medicine) Robin Call, MD as Consulting Physician (Oncology)  Hematological/Oncological History # Essential Thrombocytosis, Triple Negative  05/16/2022: Was seen by Dr. Federico for second opinion.  12/03/2023: last visit with Dr. Babara  01/15/2024: 1000 mg hydroxyurea  on Wednesday, 500 mg PO daily every other day of the week.  01/15/2024: transfer care to Dr. Federico   Interval History:  Robin Choi 63 y.o. 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 The patient's last visit was on 04/14/2024. In the interim since the last visit she has continued on hydroxyurea .  On exam today Robin Choi is accompanied by her husband.  She reports she continues taking her hydroxyurea  as prescribed 500 mg PO daily.  She reports she is not doing particular well.  She continues to have fatigue.  She is taking her hydroxyurea  500 mg p.o. daily as prescribed.  It is causing some nausea for which she is taking Zofran .  She is not having any diarrhea.  She reports he is also taking Prevacid regularly.  She reports that she only has about 2 good days per week.  She notes that she follows with endocrinology as well.  Overall her tolerance of hydroxyurea  and Eliquis  are good though she is struggling with other issues causing her fatigue.  She denies any fevers, chills, sweats, nausea, vomiting or diarrhea.  A full 10 point ROS otherwise negative.  MEDICAL HISTORY:  Past Medical History:  Diagnosis Date   GERD (gastroesophageal reflux disease)    Hypothyroidism     SURGICAL HISTORY: Past Surgical History:  Procedure Laterality Date   CARDIAC CATHETERIZATION     COLONOSCOPY WITH PROPOFOL  N/A 01/15/2022   Procedure: COLONOSCOPY WITH PROPOFOL ;  Surgeon: Therisa Bi, MD;   Location: [facility] ENDOSCOPY;  Service: Gastroenterology;  Laterality: N/A;  Needs an early appointment.  Has disabled brother and easier to get sitter early in the morning.   ESOPHAGOGASTRODUODENOSCOPY (EGD) WITH PROPOFOL  N/A 09/04/2022   Procedure: ESOPHAGOGASTRODUODENOSCOPY (EGD) WITH PROPOFOL ;  Surgeon: Therisa Bi, MD;  Location: [facility] ENDOSCOPY;  Service: Gastroenterology;  Laterality: N/A;   NOVASURE ABLATION     OTHER SURGICAL HISTORY     tubal cauterization    SOCIAL HISTORY: Social History   Socioeconomic History   Marital status: Married    Spouse name: Not on file   Number of children: Not on file   Years of education: Not on file   Highest education level: Not on file  Occupational History   Not on file  Tobacco Use   Smoking status: Former    Current packs/day: 0.00    Types: Cigarettes    Quit date: 1980    Years since quitting: 45.8   Smokeless tobacco: Never   Tobacco comments:    Quit 40 years ago  Vaping Use   Vaping status: Never Used  Substance and Sexual Activity   Alcohol use: Not Currently    Comment: occational beer   Drug use: Never   Sexual activity: Not Currently  Other Topics Concern   Not on file  Social History Narrative   Not on file   Social Drivers of Health   Financial Resource Strain: Patient Declined (07/03/2024)   Received from [facility]   Overall Financial Resource Strain (CARDIA)  Difficulty of Paying Living Expenses: Patient declined  Food Insecurity: Patient Declined (07/03/2024)   Received from [facility]   Hunger Vital Sign    Within the past 12 months, you worried that your food would run out before you got the money to buy more.: Patient declined    Within the past 12 months, the food you bought just didn't last and you didn't have money to get more.: Patient declined  Transportation Needs: Patient Declined (07/03/2024)   Received from [facility] -  Transportation    In the past 12 months, has lack of transportation kept you from medical appointments or from getting medications?: Patient declined    Lack of Transportation (Non-Medical): Patient declined  Physical Activity: Not on file  Stress: Not on file  Social Connections: Not on file  Intimate Partner Violence: Not on file    FAMILY HISTORY: Family History  Problem Relation Age of Onset   Diabetes Mother    Colon cancer Mother    Breast cancer Mother    Cancer Father        carcinoma   Bone cancer Brother    Diabetes Maternal Grandmother     ALLERGIES:  is allergic to celecoxib, nitrofurantoin macrocrystal, nitrofurantoin, and sulfa antibiotics.  MEDICATIONS:  Current Outpatient Medications  Medication Sig Dispense Refill   almotriptan (AXERT) 12.5 MG tablet      ARMOUR THYROID  90 MG tablet Take 90 mg by mouth daily.     cetirizine (ZYRTEC) 10 MG tablet Take by mouth.     clobetasol  cream (TEMOVATE ) 0.05 % Apply to affected areas hands 1-2 times a day until rash improved. Avoid face, groin, underarms. 30 g 1   ELIQUIS  2.5 MG TABS tablet Take 1 tablet by mouth twice daily 180 tablet 0   ergocalciferol (VITAMIN D2) 1.25 MG (50000 UT) capsule Take 50,000 Units by mouth once a week.     hydroxyurea  (HYDREA ) 500 MG capsule Take 1 capsule (500 mg total) by mouth See admin instructions. May take with food to minimize GI side effects. Take hydroxyurea  1000mg  for 1 day per week and 500mg  daily for rest of the week. 102 capsule 2   LANSOPRAZOLE PO Take by mouth.     metroNIDAZOLE (METROGEL) 0.75 % gel Apply 1 Application topically 2 (two) times daily. 45 g 5   ondansetron  (ZOFRAN ) 4 MG tablet Take 1 tablet (4 mg total) by mouth every 8 (eight) hours as needed for nausea or vomiting. 60 tablet 2   topiramate (TOPAMAX) 50 MG tablet Take 50 mg by mouth 2 (two) times daily.     triamcinolone ointment (KENALOG) 0.1 % Apply 1 gram twice daily to affected areas of skin. Stop once  resolved and restart as needed for flares. Avoid use on face, armpits, groin unless otherwise indicated. 30 g 0   valACYclovir (VALTREX) 1000 MG tablet      No current facility-administered medications for this visit.    REVIEW OF SYSTEMS:   Constitutional: ( - ) fevers, ( - )  chills , ( - ) night sweats Eyes: ( - ) blurriness of vision, ( - ) double vision, ( - ) watery eyes Ears, nose, mouth, throat, and face: ( - ) mucositis, ( - ) sore throat Respiratory: ( - ) cough, ( - ) dyspnea, ( - ) wheezes Cardiovascular: ( - ) palpitation, ( - ) chest discomfort, ( - ) lower extremity swelling Gastrointestinal:  ( - )  nausea, ( - ) heartburn, ( - ) change in bowel habits Skin: ( - ) abnormal skin rashes Lymphatics: ( - ) new lymphadenopathy, ( - ) easy bruising Neurological: ( - ) numbness, ( - ) tingling, ( - ) new weaknesses Behavioral/Psych: ( - ) mood change, ( - ) new changes  All other systems were reviewed with the patient and are negative.  PHYSICAL EXAMINATION:  Vitals:   07/09/24 1124  BP: 130/82  Pulse: (!) 55  Resp: 13  Temp: 97.8 F (36.6 C)  SpO2: 100%     Filed Weights   07/09/24 1124  Weight: 163 lb 11.2 oz (74.3 kg)      GENERAL: alert, no distress and comfortable SKIN: skin color, texture, turgor are normal, no rashes or significant lesions EYES: conjunctiva are pink and non-injected, sclera clear LUNGS: clear to auscultation and percussion with normal breathing effort HEART: regular rate & rhythm and no murmurs and no lower extremity edema Musculoskeletal: no cyanosis of digits and no clubbing  PSYCH: alert & oriented x 3, fluent speech NEURO: no focal motor/sensory deficits  LABORATORY DATA:  I have reviewed the data as listed    Latest Ref Rng & Units 07/09/2024   10:57 AM 04/14/2024    9:27 AM 01/15/2024    2:48 PM  CBC  WBC 4.0 - 10.5 K/uL 4.6  4.2  4.2   Hemoglobin 12.0 - 15.0 g/dL 86.7  86.9  87.9   Hematocrit 36.0 - 46.0 % 39.3  38.7  35.5    Platelets 150 - 400 K/uL 313  276  270        Latest Ref Rng & Units 07/09/2024   10:57 AM 04/14/2024    9:27 AM 01/15/2024    2:48 PM  CMP  Glucose 70 - 99 mg/dL 87  897  98   BUN 8 - 23 mg/dL 13  13  18    Creatinine 0.44 - 1.00 mg/dL 9.11  9.03  9.01   Sodium 135 - 145 mmol/L 139  139  140   Potassium 3.5 - 5.1 mmol/L 4.4  4.6  4.2   Chloride 98 - 111 mmol/L 108  110  110   CO2 22 - 32 mmol/L 26  25  24    Calcium 8.9 - 10.3 mg/dL 89.9  9.3  9.1   Total Protein 6.5 - 8.1 g/dL 7.7  7.3  7.0   Total Bilirubin 0.0 - 1.2 mg/dL 0.5  0.4  0.4   Alkaline Phos 38 - 126 U/L 50  46  45   AST 15 - 41 U/L 21  20  16    ALT 0 - 44 U/L 27  23  17      ASSESSMENT & PLAN Robin Choi 63 y.o. 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  # Essential thrombocythemia (HCC) --Myeloproliferative disease, likely essential thrombocythemia, triple negative --Bone marrow biopsy results were consistent with ET --Labs today show white blood cell 4.6, Hgb 13.2, MCV 103.4, Plt 313 (on target) --Goal to keep platelets between 150 and 400.  --Continue  hydroxyurea  500mg  PO daily   --RTC in 3 months with labs and clinic visit   # Hypothyroidism --currently managed by endocrinology.   # Pulmonary embolism (HCC) --Continue Elqiuis 2.5mg  BID   # Nausea without vomiting --Intermittent symptoms.  Recommend Zofran  as needed as directed.    # Elevated serum creatinine-resolved --Encourage patient to increase oral hydration.  No orders of the defined types were placed in this encounter.   All questions were answered. The patient knows to Choi the clinic with any problems, questions or concerns.  A total of more than 30 minutes were spent on this encounter with face-to-face time and non-face-to-face time, including preparing to see the patient, ordering tests and/or medications, counseling the patient and coordination of care as outlined above.    Robin IVAR Kidney, MD Department of Hematology/Oncology [facility] at [facility] Phone: [phone] Pager: [phone] Email: [email or web address]  07/18/2024 5:29 PM

## 2024-07-13 ENCOUNTER — Ambulatory Visit

## 2024-07-13 DIAGNOSIS — L820 Inflamed seborrheic keratosis: Secondary | ICD-10-CM

## 2024-07-13 DIAGNOSIS — S40862A Insect bite (nonvenomous) of left upper arm, initial encounter: Secondary | ICD-10-CM

## 2024-07-13 DIAGNOSIS — L821 Other seborrheic keratosis: Secondary | ICD-10-CM | POA: Diagnosis not present

## 2024-07-13 DIAGNOSIS — W908XXA Exposure to other nonionizing radiation, initial encounter: Secondary | ICD-10-CM | POA: Diagnosis not present

## 2024-07-13 DIAGNOSIS — L710 Perioral dermatitis: Secondary | ICD-10-CM

## 2024-07-13 DIAGNOSIS — D229 Melanocytic nevi, unspecified: Secondary | ICD-10-CM

## 2024-07-13 DIAGNOSIS — L578 Other skin changes due to chronic exposure to nonionizing radiation: Secondary | ICD-10-CM

## 2024-07-13 DIAGNOSIS — S40861A Insect bite (nonvenomous) of right upper arm, initial encounter: Secondary | ICD-10-CM

## 2024-07-13 DIAGNOSIS — L814 Other melanin hyperpigmentation: Secondary | ICD-10-CM

## 2024-07-13 DIAGNOSIS — W57XXXA Bitten or stung by nonvenomous insect and other nonvenomous arthropods, initial encounter: Secondary | ICD-10-CM

## 2024-07-13 MED ORDER — TRIAMCINOLONE ACETONIDE 0.1 % EX OINT: TOPICAL_OINTMENT | CUTANEOUS | 0 refills | Status: AC

## 2024-07-13 MED ORDER — METRONIDAZOLE 0.75 % EX GEL: 1.0000 | Freq: Two times a day (BID) | CUTANEOUS | 5 refills | Status: AC

## 2024-07-13 NOTE — Progress Notes (Signed)
 Subjective   Robin Choi is a 63 y.o. female who presents for the following: acne and LOC. Patient is established patient   Today patient reports: Acne around her nose and mouth; she is currently using Metronidazole gel and OTC acne treatment.  Area of concern on the right flank Areas of concern in the inframammary area Would like skin check of lesions on back   Review of Systems:    No other skin or systemic complaints except as noted in HPI or Assessment and Plan.  The following portions of the chart were reviewed this encounter and updated as appropriate: medications, allergies, medical history  Relevant Medical History:  n/a   Objective  Well appearing patient in no apparent distress; mood and affect are within normal limits. Examination was performed of the: Sun Exposed Exam: Scalp, head, eyes, ears, nose, lips, neck, upper extremities, hands, fingers, fingernails and back  Examination notable for: - Monomorphic erythematous papules on perialar skin and chin Pink papules R arm  - Multiple stuck-on brown, tan and grey papillated papules and plaques on trunk  Nevi - well demarcated brown macules  - Actinic Damage/Elastosis: chronic sun damage: dyspigmentation, telangiectasia, and wrinkling   Examination limited by: Clothing and Patient deferred removal     Right Hip, Inframammary (5) Stuck on waxy paps with erythema  Assessment & Plan   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 Condition is symptomatic/ bothersome to patient. Not currently at goal. - explained to patient that its a common facial skin problem in which groups of itchy or tender small red papules (bumps) appear around the mouth. - Recommended patient discontinue applying all face creams including topical steroids, cosmetics - Continue topical metronidazole gel once daily - Recommend gentle skin care - Discussed  Doxycyline if not well controlled with topical treatment   Insect Bites  - At bilateral arms start triamcinolone ointment 0.1% twice daily to affected areas of skin Discussed side effect of potent topical steroids including atrophy, dyspigmentation, striae, telangectasia, folliculitis, loss of skin pigment, hair growth, tachyphylaxis, risk of systemic absorption with missuse.  BENIGN SKIN FINDINGS  - Lentigines  - Seborrheic keratoses  - nevi  - Reassurance provided regarding the benign appearance of lesions noted on exam today; no treatment is indicated in the absence of symptoms/changes. - Reinforced importance of photoprotective strategies including liberal and frequent sunscreen use of a broad-spectrum SPF 30 or greater, use of protective clothing, and sun avoidance for prevention of cutaneous malignancy and photoaging.  Counseled patient on the importance of regular self-skin monitoring as well as routine clinical skin examinations as scheduled.   ACTINIC DAMAGE - Chronic condition, secondary to cumulative UV/sun exposure - Recommend daily broad spectrum sunscreen SPF 30+ to sun-exposed areas, reapply every 2 hours as needed.  - Staying in the shade or wearing long sleeves, sun glasses (UVA+UVB protection) and wide brim hats (4-inch brim around the entire circumference of the hat) are also recommended for sun protection.  - Call for new or changing lesions.  Seborrheic keratosis, inflamed - Discussed diagnosis, typical course, and treatment options for this condition - Reassurance, benign, monitor - ABCDE's discussed - Given irritation and symptoms, will proceed with cryotherapy as below   Level of service outlined above   Procedures, orders, diagnosis for this visit:  INFLAMED SEBORRHEIC KERATOSIS (5) Right Hip, Inframammary (5) Symptomatic, irritating, patient would like treated. Destruction of lesion - Right Hip, Inframammary (5) Complexity: simple  Destruction method:  cryotherapy   Informed consent: discussed and consent obtained   Timeout:  patient name, date of birth, surgical site, and procedure verified Lesion destroyed using liquid nitrogen: Yes   Region frozen until ice ball extended beyond lesion: Yes   Cryo cycles: 1 or 2. Outcome: patient tolerated procedure well with no complications   Post-procedure details: wound care instructions given     Inflamed seborrheic keratosis -     Destruction of lesion  Other orders -     metroNIDAZOLE; Apply 1 Application topically 2 (two) times daily.  Dispense: 45 g; Refill: 5 -     Triamcinolone Acetonide; Apply 1 gram twice daily to affected areas of skin. Stop once resolved and restart as needed for flares. Avoid use on face, armpits, groin unless otherwise indicated.  Dispense: 30 g; Refill: 0    Return to clinic: Return for TBSE.  I, Emerick Ege, CMA am acting as scribe for Lauraine JAYSON Kanaris, MD.   Documentation: I have reviewed the above documentation for accuracy and completeness, and I agree with the above.  Lauraine JAYSON Kanaris, MD

## 2024-07-13 NOTE — Patient Instructions (Addendum)
 Gentle Skin Care Guide  1. Bathe no more than once a day.  2. Avoid bathing in hot water  3. Use a mild soap like Dove, Vanicream, Cetaphil, CeraVe. Can use Lever 2000 or Cetaphil antibacterial soap  4. Use soap only where you need it. On most days, use it under your arms, between your legs, and on your feet. Let the water rinse other areas unless visibly dirty.  5. When you get out of the bath/shower, use a towel to gently blot your skin dry, don't rub it.  6. While your skin is still a little damp, apply a moisturizing cream such as Vanicream, CeraVe, Cetaphil, Eucerin, Sarna lotion or plain Vaseline Jelly. For hands apply Neutrogena [place] Hand Cream or Excipial Hand Cream.  7. Reapply moisturizer any time you start to itch or feel dry.  8. Sometimes using free and clear laundry detergents can be helpful. Fabric softener sheets should be avoided. Downy Free & Gentle liquid, or any liquid fabric softener that is free of dyes and perfumes, it acceptable to use  9. If your doctor has given you prescription creams you may apply moisturizers over them      Due to recent changes in healthcare laws, you may see results of your pathology and/or laboratory studies on MyChart before the doctors have had a chance to review them. We understand that in some cases there may be results that are confusing or concerning to you. Please understand that not all results are received at the same time and often the doctors may need to interpret multiple results in order to provide you with the best plan of care or course of treatment. Therefore, we ask that you please give us  2 business days to thoroughly review all your results before contacting the office for clarification. Should we see a critical lab result, you will be contacted sooner.   If You Need Anything After Your Visit  If you have any questions or concerns for your doctor, please call our main line at [phone] and press option 4 to reach  your doctor's medical assistant. If no one answers, please leave a voicemail as directed and we will return your call as soon as possible. Messages left after 4 pm will be answered the following business day.   You may also send us  a message via MyChart. We typically respond to MyChart messages within 1-2 business days.  For prescription refills, please ask your pharmacy to contact our office. Our fax number is [phone].  If you have an urgent issue when the clinic is closed that cannot wait until the next business day, you can page your doctor at the number below.    Please note that while we do our best to be available for urgent issues outside of office hours, we are not available 24/7.   If you have an urgent issue and are unable to reach us , you may choose to seek medical care at your doctor's office, retail clinic, urgent care center, or emergency room.  If you have a medical emergency, please immediately call 911 or go to the emergency department.  Pager Numbers  - Dr. Hester: [phone]  - Dr. Jackquline: [phone]  - Dr. Claudene: [phone]   - Dr. Raymund: [phone]  In the event of inclement weather, please call our main line at [phone] for an update on the status of any delays or closures.  Dermatology Medication Tips: Please keep the boxes that topical medications come in in order to help  keep track of the instructions about where and how to use these. Pharmacies typically print the medication instructions only on the boxes and not directly on the medication tubes.   If your medication is too expensive, please contact our office at [phone] option 4 or send us  a message through MyChart.   We are unable to tell what your co-pay for medications will be in advance as this is different depending on your insurance coverage. However, we may be able to find a substitute medication at lower cost or fill out paperwork to get insurance to cover a needed medication.    If a prior authorization is required to get your medication covered by your insurance company, please allow us  1-2 business days to complete this process.  Drug prices often vary depending on where the prescription is filled and some pharmacies may offer cheaper prices.  The website www.goodrx.com contains coupons for medications through different pharmacies. The prices here do not account for what the cost may be with help from insurance (it may be cheaper with your insurance), but the website can give you the price if you did not use any insurance.  - You can print the associated coupon and take it with your prescription to the pharmacy.  - You may also stop by our office during regular business hours and pick up a GoodRx coupon card.  - If you need your prescription sent electronically to a different pharmacy, notify our office through [facility] or by phone at [phone] option 4.     Si Usted Necesita Algo Despus de Su Visita  Tambin puede enviarnos un mensaje a travs de Clinical cytogeneticist. Por lo general respondemos a los mensajes de MyChart en el transcurso de 1 a 2 das hbiles.  Para renovar recetas, por favor pida a su farmacia que se ponga en contacto con nuestra oficina. Randi lakes de fax es [place] [phone].  Si tiene un asunto urgente cuando la clnica est cerrada y que no puede esperar hasta el siguiente da hbil, puede llamar/localizar a su doctor(a) al nmero que aparece a continuacin.   Por favor, tenga en cuenta que aunque hacemos todo lo posible para estar disponibles para asuntos urgentes fuera del horario de [place], no estamos disponibles las 24 horas del da, los 7 [address]  [address] de la [place].   Si tiene un problema urgente y no puede comunicarse con nosotros, puede optar por buscar atencin mdica  en el consultorio de su doctor(a), en una clnica privada, en un centro de atencin urgente o en una sala de emergencias.  Si tiene Engineer, drilling, por favor  llame inmediatamente al 911 o vaya a la sala de emergencias.  Nmeros de bper  - Dr. Hester: [phone]  - Dra. Jackquline: [phone]  - Dr. Claudene: [phone]  - Dra. Kitts: [phone]  En caso de inclemencias del [place], por favor llame a nuestra lnea principal al [phone] para una actualizacin sobre el estado de cualquier retraso o cierre.  Consejos para la medicacin en dermatologa: Por favor, guarde las cajas en las que vienen los medicamentos de uso tpico para ayudarle a seguir las instrucciones sobre dnde y cmo usarlos. Las farmacias generalmente imprimen las instrucciones del medicamento slo en las cajas y no directamente en los tubos del [place].   Si su medicamento es muy caro, por favor, pngase en contacto con landry rieger llamando al [phone] y presione la opcin 4 o envenos un mensaje a travs de Clinical cytogeneticist.   No podemos decirle cul  ser su copago por los medicamentos por adelantado ya que esto es diferente dependiendo de la cobertura de su seguro. Sin embargo, es posible que podamos encontrar un medicamento sustituto a Audiological scientist un formulario para que el seguro cubra el medicamento que se considera necesario.   Si se requiere una autorizacin previa para que su compaa de seguros malta su medicamento, por favor permtanos de 1 a 2 das hbiles para completar este proceso.  Los precios de los medicamentos varan con frecuencia dependiendo del Environmental consultant de dnde se surte la receta y alguna farmacias pueden ofrecer precios ms baratos.  El sitio web www.goodrx.com tiene cupones para medicamentos de Health and safety inspector. Los precios aqu no tienen en cuenta lo que podra costar con la ayuda del seguro (puede ser ms barato con su seguro), pero el sitio web puede darle el precio si no utiliz Tourist information centre manager.  - Puede imprimir el cupn correspondiente y llevarlo con su receta a la farmacia.  - Tambin puede pasar por nuestra oficina durante el  horario de atencin regular y Education officer, museum una tarjeta de cupones de GoodRx.  - Si necesita que su receta se enve electrnicamente a una farmacia diferente, informe a nuestra oficina a travs de MyChart de [place] o por telfono llamando al [phone] y presione la opcin 4.

## 2024-08-31 ENCOUNTER — Ambulatory Visit: Admitting: "Endocrinology

## 2024-09-14 ENCOUNTER — Other Ambulatory Visit: Payer: Self-pay | Admitting: Oncology

## 2024-09-19 ENCOUNTER — Other Ambulatory Visit: Payer: Self-pay | Admitting: Oncology

## 2024-09-22 ENCOUNTER — Telehealth: Payer: Self-pay | Admitting: *Deleted

## 2024-09-22 NOTE — Telephone Encounter (Signed)
 Received call from py . She states she has recently changed her insurance from [employer] through her husband to [employer]. She has tried to refill her Eliquis  and was told the cost would be between $300-$400. She is unable to use coupon plans like [employer] d/t having medicare. She is asking is there is any other source of financial assistance.  Advised that there is a program through the drug company of [employer] and that I can have the MD portion completed, then send it to her to complete and send off to [employer]. She is agreeable to this.  Form completed and signed by Dr. Federico. Mailed to patient.

## 2024-09-27 ENCOUNTER — Other Ambulatory Visit (HOSPITAL_COMMUNITY): Payer: Self-pay

## 2024-09-27 ENCOUNTER — Other Ambulatory Visit: Payer: Self-pay

## 2024-09-27 MED ORDER — APIXABAN 2.5 MG PO TABS
2.5000 mg | ORAL_TABLET | Freq: Two times a day (BID) | ORAL | 0 refills | Status: AC
  Filled 2024-09-27: qty 180, 90d supply, fill #0

## 2024-09-30 ENCOUNTER — Other Ambulatory Visit (HOSPITAL_COMMUNITY): Payer: Self-pay

## 2024-10-07 ENCOUNTER — Other Ambulatory Visit: Payer: Self-pay

## 2024-10-07 ENCOUNTER — Ambulatory Visit: Admitting: "Endocrinology

## 2024-10-07 DIAGNOSIS — D473 Essential (hemorrhagic) thrombocythemia: Secondary | ICD-10-CM

## 2024-10-08 ENCOUNTER — Other Ambulatory Visit: Payer: Self-pay

## 2024-10-08 ENCOUNTER — Inpatient Hospital Stay: Attending: Oncology

## 2024-10-08 ENCOUNTER — Other Ambulatory Visit: Payer: Self-pay | Admitting: Hematology and Oncology

## 2024-10-08 ENCOUNTER — Inpatient Hospital Stay

## 2024-10-08 DIAGNOSIS — D473 Essential (hemorrhagic) thrombocythemia: Secondary | ICD-10-CM

## 2024-10-08 LAB — CBC WITH DIFFERENTIAL (CANCER CENTER ONLY)
Abs Immature Granulocytes: 0.03 K/uL (ref 0.00–0.07)
Basophils Absolute: 0.1 K/uL (ref 0.0–0.1)
Basophils Relative: 1 %
Eosinophils Absolute: 0.1 K/uL (ref 0.0–0.5)
Eosinophils Relative: 3 %
HCT: 38.4 % (ref 36.0–46.0)
Hemoglobin: 12.6 g/dL (ref 12.0–15.0)
Immature Granulocytes: 1 %
Lymphocytes Relative: 39 %
Lymphs Abs: 1.6 K/uL (ref 0.7–4.0)
MCH: 34.1 pg — ABNORMAL HIGH (ref 26.0–34.0)
MCHC: 32.8 g/dL (ref 30.0–36.0)
MCV: 104.1 fL — ABNORMAL HIGH (ref 80.0–100.0)
Monocytes Absolute: 0.5 K/uL (ref 0.1–1.0)
Monocytes Relative: 11 %
Neutro Abs: 1.9 K/uL (ref 1.7–7.7)
Neutrophils Relative %: 45 %
Platelet Count: 303 K/uL (ref 150–400)
RBC: 3.69 MIL/uL — ABNORMAL LOW (ref 3.87–5.11)
RDW: 12.3 % (ref 11.5–15.5)
WBC Count: 4.2 K/uL (ref 4.0–10.5)
nRBC: 0 % (ref 0.0–0.2)

## 2024-10-08 LAB — RETIC PANEL
Immature Retic Fract: 14.6 % (ref 2.3–15.9)
RBC.: 3.67 MIL/uL — ABNORMAL LOW (ref 3.87–5.11)
Retic Count, Absolute: 62.4 K/uL (ref 19.0–186.0)
Retic Ct Pct: 1.7 % (ref 0.4–3.1)
Reticulocyte Hemoglobin: 37.9 pg

## 2024-10-08 LAB — FERRITIN: Ferritin: 235 ng/mL (ref 11–307)

## 2024-10-08 LAB — CMP (CANCER CENTER ONLY)
ALT: 45 U/L — ABNORMAL HIGH (ref 0–44)
AST: 28 U/L (ref 15–41)
Albumin: 4.5 g/dL (ref 3.5–5.0)
Alkaline Phosphatase: 56 U/L (ref 38–126)
Anion gap: 9 (ref 5–15)
BUN: 12 mg/dL (ref 8–23)
CO2: 23 mmol/L (ref 22–32)
Calcium: 9.8 mg/dL (ref 8.9–10.3)
Chloride: 104 mmol/L (ref 98–111)
Creatinine: 0.77 mg/dL (ref 0.44–1.00)
GFR, Estimated: >60 mL/min
Glucose, Bld: 105 mg/dL — ABNORMAL HIGH (ref 70–99)
Potassium: 4.4 mmol/L (ref 3.5–5.1)
Sodium: 136 mmol/L (ref 135–145)
Total Bilirubin: 0.5 mg/dL (ref 0.0–1.2)
Total Protein: 7.6 g/dL (ref 6.5–8.1)

## 2024-10-08 LAB — IRON AND TIBC
Iron: 87 ug/dL (ref 28–170)
Saturation Ratios: 29 % (ref 10.4–31.8)
TIBC: 300 ug/dL (ref 250–450)
UIBC: 213 ug/dL

## 2024-10-08 NOTE — Progress Notes (Unsigned)
 ferr

## 2024-10-13 ENCOUNTER — Ambulatory Visit

## 2024-10-14 ENCOUNTER — Ambulatory Visit: Payer: Self-pay

## 2024-10-14 NOTE — Telephone Encounter (Signed)
 Spoke with patient and forward the following message from Dr Federico . Please let Robin Choi know that her labs look great. Her Plt are on target <400. We will see her back as scheduled in April 2026 Pt voiced understanding.

## 2024-10-14 NOTE — Telephone Encounter (Signed)
-----   Message from Nurse Almarie DASEN, RN sent at 10/13/2024  4:41 PM EST -----  ----- Message ----- From: Federico Norleen DASEN [place], MD Sent: 10/12/2024   8:06 AM EST To: Almarie DELENA Arabia, RN  Please let Mrs. Riker know that her labs look great. Her Plt are on target <400. We will see her back as scheduled in April 2026

## 2025-01-07 ENCOUNTER — Inpatient Hospital Stay: Attending: Oncology | Admitting: Hematology and Oncology

## 2025-01-07 ENCOUNTER — Inpatient Hospital Stay
